# Patient Record
Sex: Female | Born: 1960 | Race: White | Hispanic: No | Smoking: Never smoker
Health system: Southern US, Community
[De-identification: ages and names within clinical notes are randomized; demographics above are authoritative.]

## PROBLEM LIST (undated history)

## (undated) DIAGNOSIS — E785 Hyperlipidemia, unspecified: Secondary | ICD-10-CM

## (undated) DIAGNOSIS — I10 Essential (primary) hypertension: Secondary | ICD-10-CM

## (undated) HISTORY — PX: TONSILLECTOMY: SUR1361

## (undated) HISTORY — DX: Essential (primary) hypertension: I10

## (undated) HISTORY — PX: EYE SURGERY: SHX253

## (undated) HISTORY — PX: FOOT SURGERY: SHX648

## (undated) HISTORY — PX: BUNIONECTOMY: SHX129

## (undated) HISTORY — PX: CHOLECYSTECTOMY: SHX55

## (undated) HISTORY — DX: Hyperlipidemia, unspecified: E78.5

---

## 1998-12-19 ENCOUNTER — Other Ambulatory Visit: Admission: RE | Admit: 1998-12-19 | Discharge: 1998-12-19 | Payer: Self-pay | Admitting: Family Medicine

## 1999-03-12 ENCOUNTER — Encounter: Payer: Self-pay | Admitting: Family Medicine

## 1999-03-12 ENCOUNTER — Encounter: Admission: RE | Admit: 1999-03-12 | Discharge: 1999-03-12 | Payer: Self-pay | Admitting: Family Medicine

## 1999-10-25 ENCOUNTER — Encounter: Payer: Self-pay | Admitting: Emergency Medicine

## 1999-10-25 ENCOUNTER — Emergency Department (HOSPITAL_COMMUNITY): Admission: EM | Admit: 1999-10-25 | Discharge: 1999-10-25 | Payer: Self-pay | Admitting: Emergency Medicine

## 1999-11-11 ENCOUNTER — Encounter: Admission: RE | Admit: 1999-11-11 | Discharge: 1999-11-29 | Payer: Self-pay | Admitting: Family Medicine

## 2000-02-18 ENCOUNTER — Other Ambulatory Visit: Admission: RE | Admit: 2000-02-18 | Discharge: 2000-02-18 | Payer: Self-pay | Admitting: Family Medicine

## 2001-04-12 ENCOUNTER — Other Ambulatory Visit: Admission: RE | Admit: 2001-04-12 | Discharge: 2001-04-12 | Payer: Self-pay | Admitting: Family Medicine

## 2002-05-09 ENCOUNTER — Other Ambulatory Visit: Admission: RE | Admit: 2002-05-09 | Discharge: 2002-05-09 | Payer: Self-pay | Admitting: Family Medicine

## 2004-03-28 ENCOUNTER — Other Ambulatory Visit: Admission: RE | Admit: 2004-03-28 | Discharge: 2004-03-28 | Payer: Self-pay | Admitting: Family Medicine

## 2007-01-23 ENCOUNTER — Emergency Department (HOSPITAL_COMMUNITY): Admission: EM | Admit: 2007-01-23 | Discharge: 2007-01-23 | Payer: Self-pay | Admitting: Family Medicine

## 2007-12-30 ENCOUNTER — Emergency Department (HOSPITAL_COMMUNITY): Admission: EM | Admit: 2007-12-30 | Discharge: 2007-12-30 | Payer: Self-pay | Admitting: Emergency Medicine

## 2009-10-01 ENCOUNTER — Emergency Department (HOSPITAL_COMMUNITY)
Admission: EM | Admit: 2009-10-01 | Discharge: 2009-10-02 | Payer: Self-pay | Source: Home / Self Care | Admitting: Emergency Medicine

## 2009-10-10 ENCOUNTER — Encounter: Admission: RE | Admit: 2009-10-10 | Discharge: 2009-10-10 | Payer: Self-pay | Admitting: Family Medicine

## 2010-01-03 ENCOUNTER — Ambulatory Visit (HOSPITAL_COMMUNITY)
Admission: RE | Admit: 2010-01-03 | Discharge: 2010-01-03 | Payer: Self-pay | Source: Home / Self Care | Attending: Surgery | Admitting: Surgery

## 2010-01-09 ENCOUNTER — Emergency Department (HOSPITAL_COMMUNITY)
Admission: EM | Admit: 2010-01-09 | Discharge: 2010-01-09 | Payer: Self-pay | Source: Home / Self Care | Admitting: Emergency Medicine

## 2010-01-11 ENCOUNTER — Encounter
Admission: RE | Admit: 2010-01-11 | Discharge: 2010-01-11 | Payer: Self-pay | Source: Home / Self Care | Attending: Surgery | Admitting: Surgery

## 2010-04-01 LAB — COMPREHENSIVE METABOLIC PANEL
AST: 158 U/L — ABNORMAL HIGH (ref 0–37)
Albumin: 3.5 g/dL (ref 3.5–5.2)
Calcium: 9.3 mg/dL (ref 8.4–10.5)
Creatinine, Ser: 0.94 mg/dL (ref 0.4–1.2)
GFR calc Af Amer: >60 mL/min (ref >60)
Total Protein: 6.8 g/dL (ref 6.0–8.3)

## 2010-04-01 LAB — CBC
MCH: 28.5 pg (ref 26.0–34.0)
MCHC: 33.2 g/dL (ref 30.0–36.0)
Platelets: 307 10*3/uL (ref 150–400)
RBC: 4.52 MIL/uL (ref 3.87–5.11)

## 2010-04-01 LAB — DIFFERENTIAL
Eosinophils Relative: 1 % (ref 0–5)
Lymphocytes Relative: 31 % (ref 12–46)
Lymphs Abs: 3.8 10*3/uL (ref 0.7–4.0)
Monocytes Absolute: 0.7 10*3/uL (ref 0.1–1.0)

## 2010-04-01 LAB — URINALYSIS, ROUTINE W REFLEX MICROSCOPIC
Bilirubin Urine: NEGATIVE
Hgb urine dipstick: NEGATIVE
Nitrite: NEGATIVE
Specific Gravity, Urine: 1.009 (ref 1.005–1.030)
pH: 7 (ref 5.0–8.0)

## 2010-04-02 LAB — URINALYSIS, ROUTINE W REFLEX MICROSCOPIC
Glucose, UA: NEGATIVE mg/dL
Hgb urine dipstick: NEGATIVE
Specific Gravity, Urine: 1.008 (ref 1.005–1.030)
pH: 5.5 (ref 5.0–8.0)

## 2010-04-02 LAB — CBC
Hemoglobin: 13.1 g/dL (ref 12.0–15.0)
MCHC: 32.5 g/dL (ref 30.0–36.0)
Platelets: 262 10*3/uL (ref 150–400)
RBC: 4.64 MIL/uL (ref 3.87–5.11)

## 2010-04-02 LAB — SURGICAL PCR SCREEN: MRSA, PCR: NEGATIVE

## 2010-04-02 LAB — DIFFERENTIAL
Eosinophils Absolute: 0 10*3/uL (ref 0.0–0.7)
Eosinophils Relative: 1 % (ref 0–5)
Lymphs Abs: 1.9 10*3/uL (ref 0.7–4.0)
Monocytes Absolute: 0.3 10*3/uL (ref 0.1–1.0)
Monocytes Relative: 5 % (ref 3–12)

## 2010-04-02 LAB — COMPREHENSIVE METABOLIC PANEL
ALT: 8 U/L (ref 0–35)
AST: 14 U/L (ref 0–37)
Albumin: 3.9 g/dL (ref 3.5–5.2)
CO2: 26 mEq/L (ref 19–32)
Calcium: 9.5 mg/dL (ref 8.4–10.5)
GFR calc Af Amer: >60 mL/min (ref >60)
GFR calc non Af Amer: >60 mL/min (ref >60)
Sodium: 138 mEq/L (ref 135–145)
Total Protein: 7.2 g/dL (ref 6.0–8.3)

## 2010-04-02 LAB — AMYLASE: Amylase: 81 U/L (ref 0–105)

## 2010-04-04 LAB — CBC
HCT: 37.8 % (ref 36.0–46.0)
Hemoglobin: 12.3 g/dL (ref 12.0–15.0)
MCH: 28.2 pg (ref 26.0–34.0)
MCHC: 32.5 g/dL (ref 30.0–36.0)
MCV: 86.7 fL (ref 78.0–100.0)
Platelets: 276 10*3/uL (ref 150–400)
RBC: 4.36 MIL/uL (ref 3.87–5.11)
RDW: 13.5 % (ref 11.5–15.5)
WBC: 7.8 10*3/uL (ref 4.0–10.5)

## 2010-04-04 LAB — BASIC METABOLIC PANEL WITH GFR
BUN: 6 mg/dL (ref 6–23)
Calcium: 9.1 mg/dL (ref 8.4–10.5)
Creatinine, Ser: 0.58 mg/dL (ref 0.4–1.2)
GFR calc non Af Amer: >60 mL/min (ref >60)
Glucose, Bld: 110 mg/dL — ABNORMAL HIGH (ref 70–99)
Potassium: 3.2 meq/L — ABNORMAL LOW (ref 3.5–5.1)

## 2010-04-04 LAB — DIFFERENTIAL
Basophils Absolute: 0 K/uL (ref 0.0–0.1)
Basophils Relative: 0 % (ref 0–1)
Eosinophils Absolute: 0.1 10*3/uL (ref 0.0–0.7)
Eosinophils Relative: 1 % (ref 0–5)
Lymphocytes Relative: 30 % (ref 12–46)
Lymphs Abs: 2.4 10*3/uL (ref 0.7–4.0)
Monocytes Absolute: 0.5 K/uL (ref 0.1–1.0)
Monocytes Relative: 7 % (ref 3–12)
Neutro Abs: 4.9 K/uL (ref 1.7–7.7)
Neutrophils Relative %: 62 % (ref 43–77)

## 2010-04-04 LAB — POCT CARDIAC MARKERS
CKMB, poc: <1 ng/mL — ABNORMAL LOW (ref 1.0–8.0)
Myoglobin, poc: 44 ng/mL (ref 12–200)
Troponin i, poc: <0.05 ng/mL (ref 0.00–0.09)

## 2010-04-04 LAB — BASIC METABOLIC PANEL
CO2: 28 mEq/L (ref 19–32)
Chloride: 102 mEq/L (ref 96–112)
GFR calc Af Amer: >60 mL/min (ref >60)
Sodium: 135 mEq/L (ref 135–145)

## 2011-10-05 IMAGING — CT CT ABD-PELV W/ CM
1 of 3 series · 14 of 32 positions shown, 19 images · IV contrast (omnipaque)
Comparison: CT abdomen pelvis of 10/02/2009

CLINICAL DATA: Epigastric pain, nausea, status post laparoscopic
cholecystectomy on 01/03/2010

CT ABDOMEN AND PELVIS WITH CONTRAST
TECHNIQUE: Multidetector CT imaging of the abdomen and pelvis was
performed following the standard protocol during bolus
administration of intravenous contrast.
Contrast: 100 ml Omnipaque-S66

[Series 2: rtn ap with st · axial · 0.78mm/px · z∈[+784,+1184]mm · 14 of 90 slices shown, 19 images]
[im 5/90  soft-tissue]
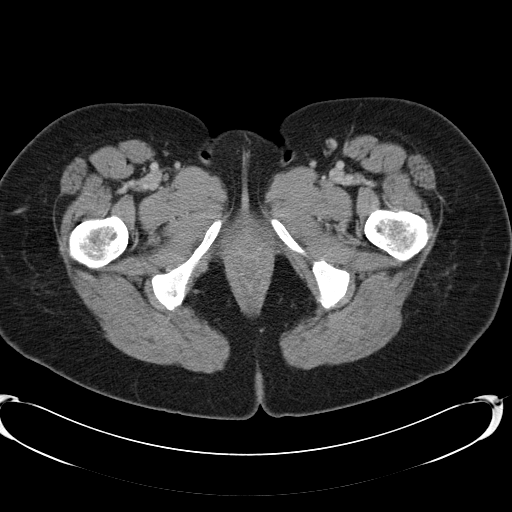
[im 5/90  bone]
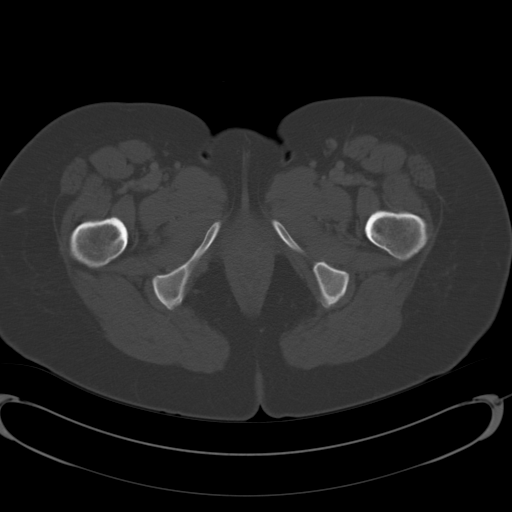
[im 14/90  soft-tissue]
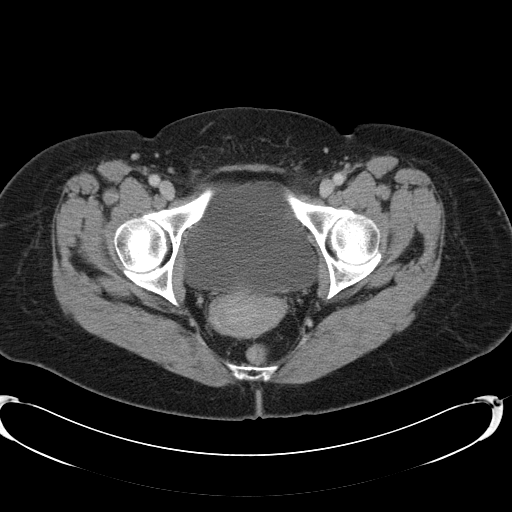
[im 18/90  soft-tissue]
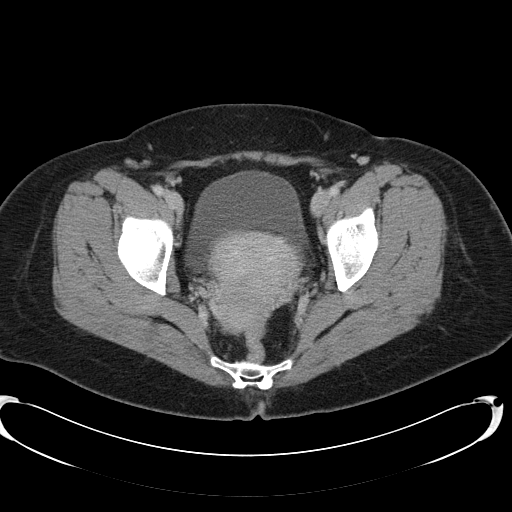
[im 27/90  soft-tissue]
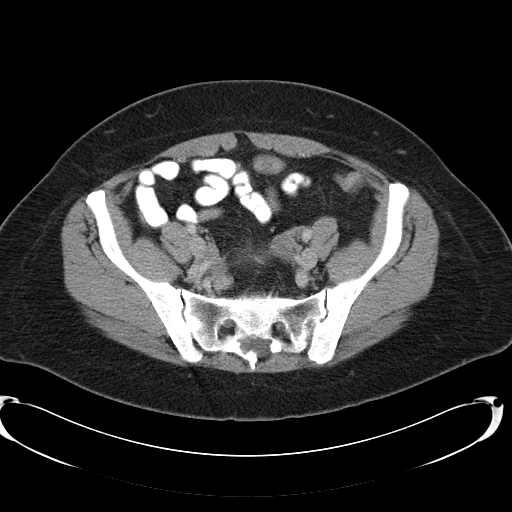
[im 32/90  soft-tissue]
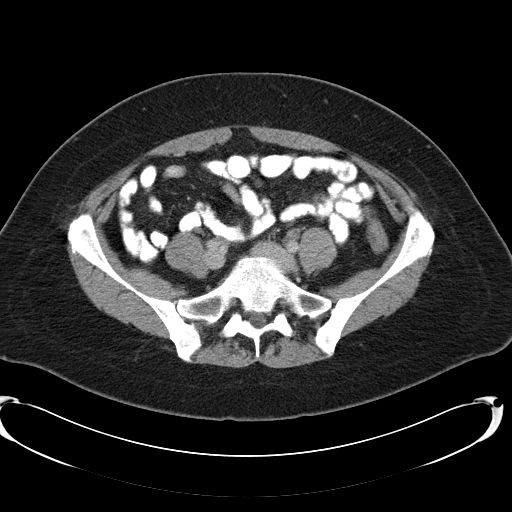
[im 41/90  soft-tissue]
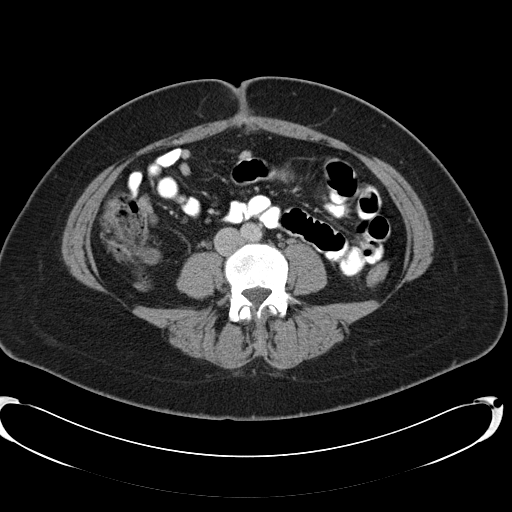
[im 45/90  soft-tissue]
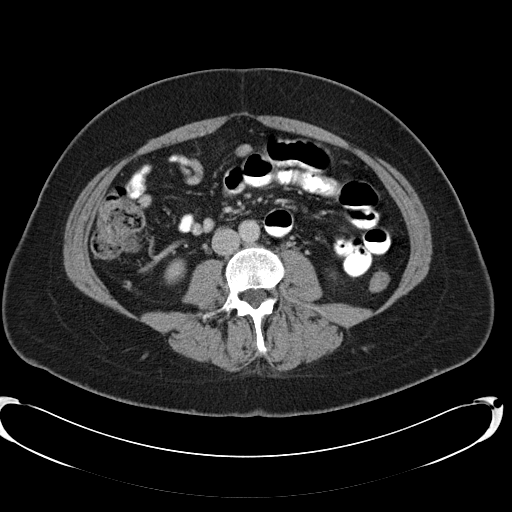
[im 49/90  soft-tissue]
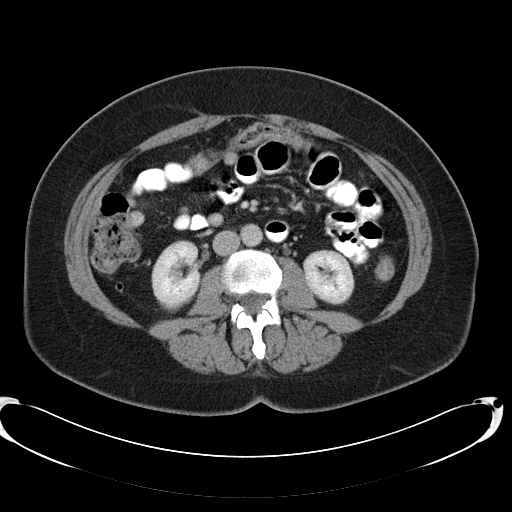
[im 58/90  soft-tissue]
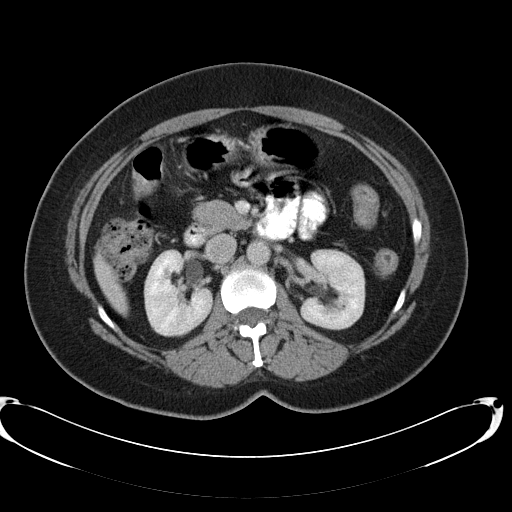
[im 58/90  bone]
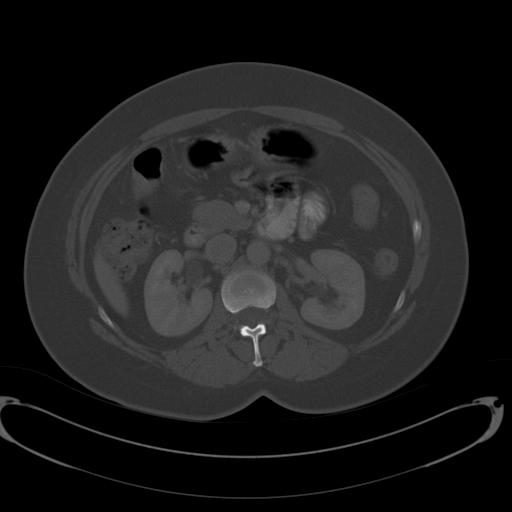
[im 63/90  soft-tissue]
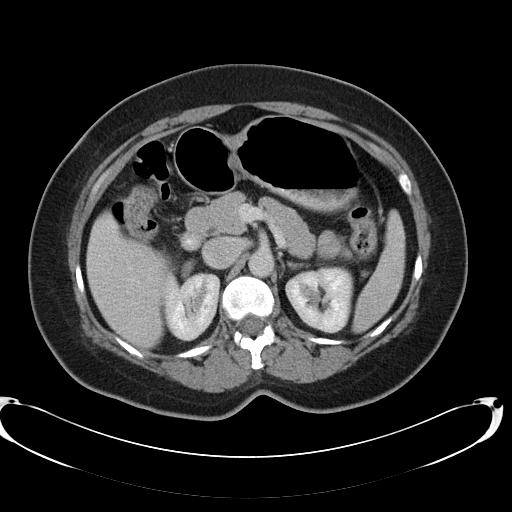
[im 72/90  soft-tissue]
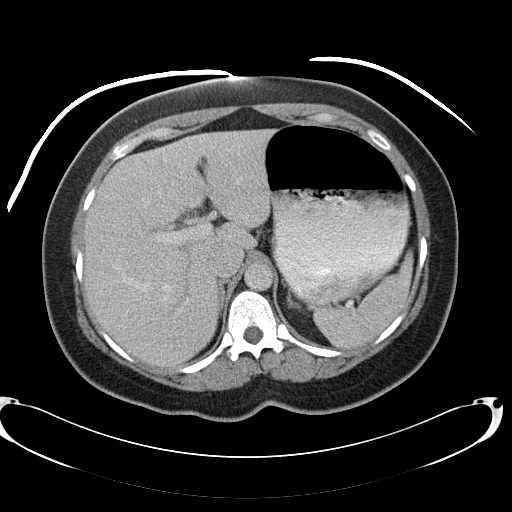
[im 72/90  lung]
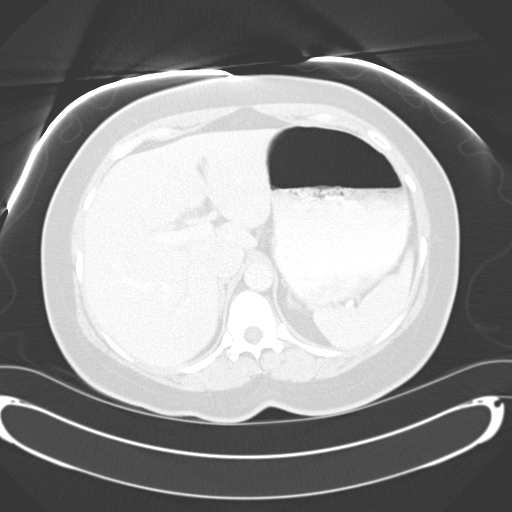
[im 76/90  soft-tissue]
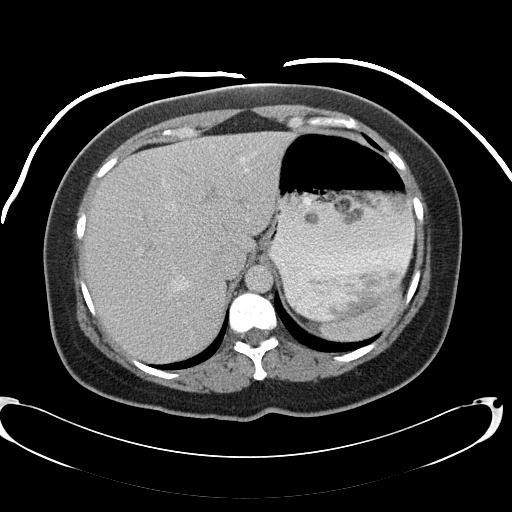
[im 76/90  lung]
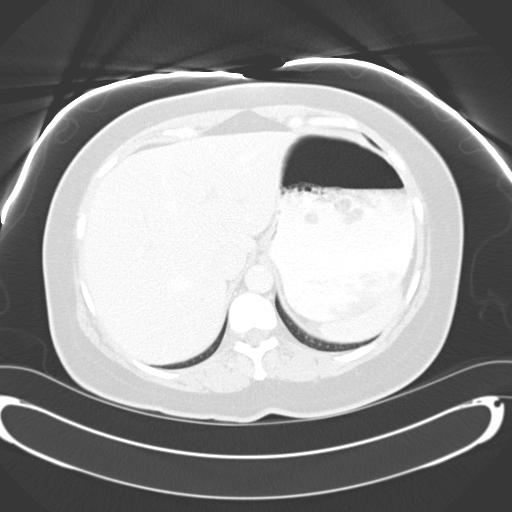
[im 81/90  lung]
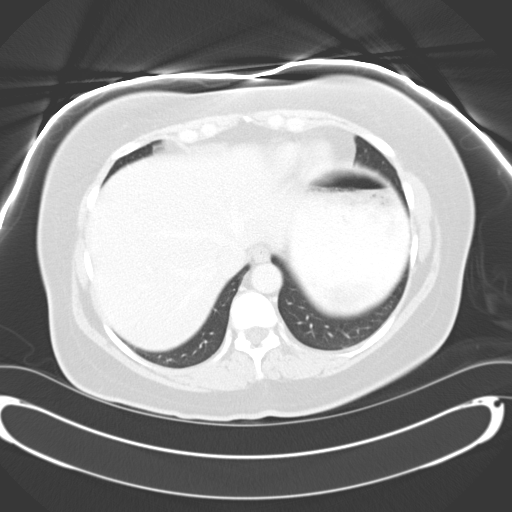
[im 85/90  soft-tissue]
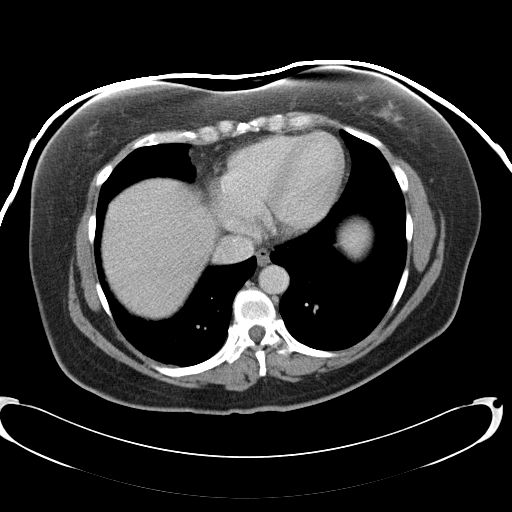
[im 85/90  lung]
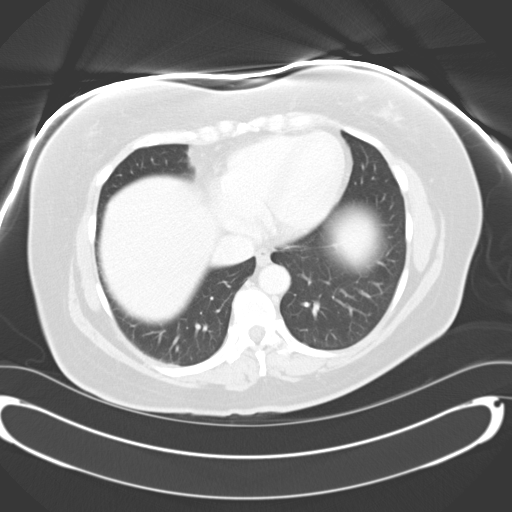

[14 of 32 positions shown; findings below may reference images not displayed]

FINDINGS: The lung bases are clear.  The liver enhances with no
focal abnormality.  There is minimal prominence of central
intrahepatic ducts most likely normal in this patient post
cholecystectomy.  No fluid collection is seen in the gallbladder
bed with minimal strandiness presumably postoperative in nature.
The pancreas is normal in size and the pancreatic duct is not
dilated.  The adrenal glands and spleen are unremarkable.  The
stomach is moderately distended with food and fluid, and is
unremarkable.  The kidneys enhance with no calculus or mass and no
hydronephrosis is seen.  Small parapelvic  cysts are noted on the
left.  The abdominal aorta is normal in caliber.  The appendix as
well visualized in the right lower quadrant extending cephalad, and
filling with air normally.  The terminal ileum is unremarkable.
The uterus again is somewhat lobular as described previously.  The
urinary bladder is unremarkable.  Small ovarian follicles are
present.  No significant free fluid is seen within the pelvis.  No
distention of large or small bowel is seen.  No bony abnormality is
seen
IMPRESSION: .
1.  No evidence of acute inflammatory process within the abdomen or
pelvis.
2.  The appendix and terminal ileum appear normal.
3.  No abnormality in the gallbladder bed status post laparoscopic
cholecystectomy.  Expected postoperative change.

## 2012-04-22 ENCOUNTER — Ambulatory Visit (INDEPENDENT_AMBULATORY_CARE_PROVIDER_SITE_OTHER): Payer: Managed Care, Other (non HMO) | Admitting: Podiatry

## 2012-04-22 ENCOUNTER — Encounter: Payer: Self-pay | Admitting: Podiatry

## 2012-04-22 VITALS — BP 149/85 | HR 67 | Ht 62.0 in | Wt 194.0 lb

## 2012-04-22 DIAGNOSIS — L988 Other specified disorders of the skin and subcutaneous tissue: Secondary | ICD-10-CM

## 2012-04-22 DIAGNOSIS — R234 Changes in skin texture: Secondary | ICD-10-CM | POA: Insufficient documentation

## 2012-04-22 DIAGNOSIS — M25579 Pain in unspecified ankle and joints of unspecified foot: Secondary | ICD-10-CM

## 2012-04-22 NOTE — Progress Notes (Signed)
Subjective: 52 y.o. year old female patient presents complaining cracking heel. She has tried home remedy that helped for a while. Now both heels are painful from cracks. trimmed. Patient was seen at this office several years ago for the same problem.  Review of Systems - General ROS: negative for - chills, fatigue, fever, hot flashes, malaise, night sweats, sleep disturbance, weight gain or weight loss.  Objective: Dermatologic: Thick dry callus with cracked skin both heels. No open skin noted. Fissures limited to epidermal layer without deep penetration to subdermal layer. Vascular: Pedal pulses are all palpable. Orthopedic: Rectus foot without gross deformity bilateral. Neurologic: All epicritic and tactile sensations grossly intact.  Assessment: Fissured callus both heels.  Treatment: Both feet soaked for 10 minutes. Debrided all hard callused area. Return as needed.

## 2012-04-22 NOTE — Patient Instructions (Addendum)
Seen for heel callus. Debrided all cracked heel with soaking. Please continue home remedy, scrub at the end of shower with soap.  Return as needed.

## 2012-07-22 ENCOUNTER — Ambulatory Visit: Payer: Managed Care, Other (non HMO) | Admitting: Podiatry

## 2012-07-27 ENCOUNTER — Ambulatory Visit: Payer: Managed Care, Other (non HMO) | Admitting: Podiatry

## 2012-09-16 DIAGNOSIS — E782 Mixed hyperlipidemia: Secondary | ICD-10-CM | POA: Insufficient documentation

## 2013-03-29 ENCOUNTER — Ambulatory Visit: Payer: Managed Care, Other (non HMO) | Admitting: Podiatry

## 2013-04-05 ENCOUNTER — Encounter: Payer: Self-pay | Admitting: Podiatry

## 2013-04-05 ENCOUNTER — Ambulatory Visit (INDEPENDENT_AMBULATORY_CARE_PROVIDER_SITE_OTHER): Payer: Managed Care, Other (non HMO) | Admitting: Podiatry

## 2013-04-05 VITALS — BP 133/83 | HR 71 | Ht 62.0 in | Wt 195.0 lb

## 2013-04-05 DIAGNOSIS — M79609 Pain in unspecified limb: Secondary | ICD-10-CM

## 2013-04-05 DIAGNOSIS — L851 Acquired keratosis [keratoderma] palmaris et plantaris: Secondary | ICD-10-CM

## 2013-04-05 DIAGNOSIS — R234 Changes in skin texture: Secondary | ICD-10-CM

## 2013-04-05 DIAGNOSIS — Q828 Other specified congenital malformations of skin: Secondary | ICD-10-CM

## 2013-04-05 DIAGNOSIS — L988 Other specified disorders of the skin and subcutaneous tissue: Secondary | ICD-10-CM

## 2013-04-05 NOTE — Progress Notes (Signed)
Subjective:  53 y.o. year old female patient presents complaining cracking heel.  Both heels are painful from cracks.  Patient was seen at this office several last year for the same problem.   Objective: Dermatologic: Thick dry callus with cracked skin both heels.  No open skin noted. Fissures limited to epidermal layer without deep penetration to subdermal layer.  Vascular: Pedal pulses are all palpable.  Orthopedic: Rectus foot without gross deformity bilateral.  Neurologic: All epicritic and tactile sensations grossly intact.   Assessment:  Painful fissured callus both heels.   Treatment: Both feet soaked for 10 minutes. Debrided all hard callused area.  Return as needed.

## 2013-04-05 NOTE — Patient Instructions (Signed)
Seen for painful heel with cracked calluses. All debrided. Return as needed.

## 2013-07-21 DIAGNOSIS — E669 Obesity, unspecified: Secondary | ICD-10-CM | POA: Insufficient documentation

## 2013-09-20 DIAGNOSIS — Z8049 Family history of malignant neoplasm of other genital organs: Secondary | ICD-10-CM | POA: Insufficient documentation

## 2014-02-24 ENCOUNTER — Encounter: Payer: Self-pay | Admitting: Podiatry

## 2014-02-24 ENCOUNTER — Ambulatory Visit (INDEPENDENT_AMBULATORY_CARE_PROVIDER_SITE_OTHER): Payer: Managed Care, Other (non HMO) | Admitting: Podiatry

## 2014-02-24 VITALS — BP 142/77 | HR 59

## 2014-02-24 DIAGNOSIS — Q828 Other specified congenital malformations of skin: Secondary | ICD-10-CM

## 2014-02-24 DIAGNOSIS — R234 Changes in skin texture: Secondary | ICD-10-CM

## 2014-02-24 DIAGNOSIS — L570 Actinic keratosis: Secondary | ICD-10-CM

## 2014-02-24 DIAGNOSIS — L989 Disorder of the skin and subcutaneous tissue, unspecified: Secondary | ICD-10-CM

## 2014-02-24 MED ORDER — CICLOPIROX 8 % EX SOLN: Freq: Every day | CUTANEOUS | Status: DC

## 2014-02-24 NOTE — Patient Instructions (Signed)
Seen for cracking heel calluses. All calluses debrided. Rx. Penlac for fungal nails.  Return in 3 months or as needed.

## 2014-02-24 NOTE — Progress Notes (Signed)
Subjective:  54 y.o. year old female patient presents complaining cracking heel.  Both heels are painful from cracks.  Her last visit to the office was March 2015 for the same problem.  Objective: Dermatologic: Thick dry callus with cracked skin both heels.  Discoloration on nail plate with thickness on 2nd toe both feet. Dark discolored 2nd toe left foot.  Fissures limited to epidermal layer without deep penetration to subdermal layer.  Vascular: Pedal pulses are all palpable.  Orthopedic: Rectus foot without gross deformity bilateral.  Neurologic: All epicritic and tactile sensations grossly intact.   Assessment:  Painful fissured callus both heels.  Mycotic nails x 10, early stage.  Treatment: Both feet soaked for 10 minutes. Debrided all hard callused area.  Rx. Penlac. Return as needed.

## 2014-06-02 ENCOUNTER — Ambulatory Visit: Payer: Managed Care, Other (non HMO) | Admitting: Podiatry

## 2015-02-28 ENCOUNTER — Ambulatory Visit (INDEPENDENT_AMBULATORY_CARE_PROVIDER_SITE_OTHER): Payer: Managed Care, Other (non HMO) | Admitting: Podiatry

## 2015-02-28 ENCOUNTER — Encounter: Payer: Self-pay | Admitting: Podiatry

## 2015-02-28 VITALS — BP 163/90 | HR 80

## 2015-02-28 DIAGNOSIS — Q828 Other specified congenital malformations of skin: Secondary | ICD-10-CM

## 2015-02-28 DIAGNOSIS — M79606 Pain in leg, unspecified: Secondary | ICD-10-CM | POA: Diagnosis not present

## 2015-02-28 DIAGNOSIS — L570 Actinic keratosis: Secondary | ICD-10-CM | POA: Diagnosis not present

## 2015-02-28 DIAGNOSIS — R234 Changes in skin texture: Secondary | ICD-10-CM

## 2015-02-28 DIAGNOSIS — L989 Disorder of the skin and subcutaneous tissue, unspecified: Secondary | ICD-10-CM

## 2015-02-28 NOTE — Progress Notes (Signed)
Subjective:  55 y.o. year old female patient presents complaining cracking heel.  Both heels are painful from cracks and right heel has bled.  Her last visit to the office was February 2016 for the same problem.  Objective: Dermatologic:  Thick dry fissured keratosis both heels plantar posterior. Open skin lesion plantar lateral right heel. Discoloration on nail plate with thickness on 2nd toe both feet. Dark discolored 2nd toe left foot.  Fissures limited to skin layer without deep penetration to subdermal layer.  Vascular: Pedal pulses are all palpable.  Orthopedic: Rectus foot without gross deformity bilateral.  Neurologic: All epicritic and tactile sensations grossly intact.   Assessment:  Painful fissured callus both heels with open lesion right lateral heel.  Plantar heel keratosis bilateral.  Treatment: Both feet soaked for 10 minutes.  Debrided all fissured keratotic lesion bilateral. Right plantar lateral heel open lesion cleansed with Iodine and DSD applied. Return as needed.

## 2015-02-28 NOTE — Patient Instructions (Signed)
Seen for cracking callus on both heels. Soaked and debrided. Return as needed.

## 2016-05-22 ENCOUNTER — Ambulatory Visit (INDEPENDENT_AMBULATORY_CARE_PROVIDER_SITE_OTHER): Payer: Managed Care, Other (non HMO) | Admitting: Podiatry

## 2016-05-22 ENCOUNTER — Encounter: Payer: Self-pay | Admitting: Podiatry

## 2016-05-22 VITALS — BP 149/84 | HR 67

## 2016-05-22 DIAGNOSIS — R234 Changes in skin texture: Secondary | ICD-10-CM | POA: Diagnosis not present

## 2016-05-22 DIAGNOSIS — Q828 Other specified congenital malformations of skin: Secondary | ICD-10-CM

## 2016-05-22 NOTE — Progress Notes (Signed)
Subjective:  56 y.o. year old female patient presents complaining cracking heel.  She is using Vitamin A cream with Salsun blue scrub after each shower. She noted of improvement. Her last visit to the office was February 2017 for the same problem.  Objective: Dermatologic:  Thick dry fissured keratosis both heels plantar posterior. Fissures limited to skin layer without deep penetration to subdermal layer.  Vascular: Pedal pulses are all palpable.  Orthopedic: Rectus foot without gross deformity bilateral.  Neurologic: All epicritic and tactile sensations grossly intact.   Assessment:  Painful fissured callus both heels without open skin. Plantar heel keratosis bilateral.  Treatment: Debrided all fissured keratotic lesion bilateral. Return as needed.

## 2016-05-22 NOTE — Patient Instructions (Signed)
Follow up on plantar heel fissured calluses. Both heels are doing well with minor dry cracks in skin.  All debrided. Continue with Salsun blue scrub and Vitamin A cream. Return as needed.

## 2017-05-27 ENCOUNTER — Ambulatory Visit (INDEPENDENT_AMBULATORY_CARE_PROVIDER_SITE_OTHER): Payer: 59 | Admitting: Podiatry

## 2017-05-27 DIAGNOSIS — R234 Changes in skin texture: Secondary | ICD-10-CM | POA: Diagnosis not present

## 2017-05-27 DIAGNOSIS — Q828 Other specified congenital malformations of skin: Secondary | ICD-10-CM

## 2017-05-27 NOTE — Patient Instructions (Signed)
Seen for cracking callus on left heel. All debrided. Return as needed.

## 2017-05-28 ENCOUNTER — Encounter: Payer: Self-pay | Admitting: Podiatry

## 2017-05-28 NOTE — Progress Notes (Signed)
Subjective: 57 y.o. year old female patient presents for follow up on cracking heels.  Objective: Dermatologic: Thick dry fissured keratosis on right heel. Left heel has much improved with smooth surface..  Vascular:Pedal pulses are all palpable.  Orthopedic:Rectus foot without gross deformity bilateral.  Neurologic:All epicritic and tactile sensations grossly intact.   Assessment: Painful fissured callus on left without open skin. Right heel has much improved. Plantar heel keratosis bilateral.  Treatment: Debrided all fissured keratotic lesion bilateral. Return as needed.

## 2017-06-18 LAB — HM PAP SMEAR

## 2018-08-04 DIAGNOSIS — H524 Presbyopia: Secondary | ICD-10-CM | POA: Diagnosis not present

## 2018-09-17 DIAGNOSIS — Z0100 Encounter for examination of eyes and vision without abnormal findings: Secondary | ICD-10-CM | POA: Diagnosis not present

## 2019-10-10 DIAGNOSIS — H524 Presbyopia: Secondary | ICD-10-CM | POA: Diagnosis not present

## 2020-01-27 ENCOUNTER — Ambulatory Visit (INDEPENDENT_AMBULATORY_CARE_PROVIDER_SITE_OTHER): Payer: No Typology Code available for payment source | Admitting: Family Medicine

## 2020-01-27 ENCOUNTER — Encounter: Payer: Self-pay | Admitting: Family Medicine

## 2020-01-27 ENCOUNTER — Other Ambulatory Visit: Payer: Self-pay

## 2020-01-27 VITALS — BP 126/78 | HR 74 | Temp 98.3°F | Resp 18 | Ht 62.0 in | Wt 215.0 lb

## 2020-01-27 DIAGNOSIS — Z8049 Family history of malignant neoplasm of other genital organs: Secondary | ICD-10-CM | POA: Diagnosis not present

## 2020-01-27 DIAGNOSIS — Z1231 Encounter for screening mammogram for malignant neoplasm of breast: Secondary | ICD-10-CM | POA: Diagnosis not present

## 2020-01-27 DIAGNOSIS — E669 Obesity, unspecified: Secondary | ICD-10-CM | POA: Diagnosis not present

## 2020-01-27 DIAGNOSIS — E782 Mixed hyperlipidemia: Secondary | ICD-10-CM | POA: Diagnosis not present

## 2020-01-27 DIAGNOSIS — R7989 Other specified abnormal findings of blood chemistry: Secondary | ICD-10-CM

## 2020-01-27 DIAGNOSIS — Z0000 Encounter for general adult medical examination without abnormal findings: Secondary | ICD-10-CM

## 2020-01-27 DIAGNOSIS — Z800 Family history of malignant neoplasm of digestive organs: Secondary | ICD-10-CM | POA: Diagnosis not present

## 2020-01-27 LAB — COMPREHENSIVE METABOLIC PANEL
ALT: 44 U/L — ABNORMAL HIGH (ref 0–35)
AST: 38 U/L — ABNORMAL HIGH (ref 0–37)
Albumin: 4.9 g/dL (ref 3.5–5.2)
Alkaline Phosphatase: 70 U/L (ref 39–117)
BUN: 15 mg/dL (ref 6–23)
CO2: 24 mEq/L (ref 19–32)
Calcium: 10.3 mg/dL (ref 8.4–10.5)
Chloride: 104 mEq/L (ref 96–112)
Creatinine, Ser: 0.8 mg/dL (ref 0.40–1.20)
GFR: 81.57 mL/min (ref >60.00)
Glucose, Bld: 90 mg/dL (ref 70–99)
Potassium: 4 mEq/L (ref 3.5–5.1)
Sodium: 137 mEq/L (ref 135–145)
Total Bilirubin: 0.4 mg/dL (ref 0.2–1.2)
Total Protein: 7.7 g/dL (ref 6.0–8.3)

## 2020-01-27 LAB — CBC WITH DIFFERENTIAL/PLATELET
Basophils Absolute: 0 10*3/uL (ref 0.0–0.1)
Basophils Relative: 0.8 % (ref 0.0–3.0)
Eosinophils Absolute: 0.1 10*3/uL (ref 0.0–0.7)
Eosinophils Relative: 0.9 % (ref 0.0–5.0)
HCT: 43.2 % (ref 36.0–46.0)
Hemoglobin: 14.6 g/dL (ref 12.0–15.0)
Lymphocytes Relative: 36.7 % (ref 12.0–46.0)
Lymphs Abs: 2.2 10*3/uL (ref 0.7–4.0)
MCHC: 33.9 g/dL (ref 30.0–36.0)
MCV: 85.2 fl (ref 78.0–100.0)
Monocytes Absolute: 0.3 10*3/uL (ref 0.1–1.0)
Monocytes Relative: 5.4 % (ref 3.0–12.0)
Neutro Abs: 3.4 10*3/uL (ref 1.4–7.7)
Neutrophils Relative %: 56.2 % (ref 43.0–77.0)
Platelets: 257 10*3/uL (ref 150.0–400.0)
RBC: 5.07 Mil/uL (ref 3.87–5.11)
RDW: 13.9 % (ref 11.5–15.5)
WBC: 6.1 10*3/uL (ref 4.0–10.5)

## 2020-01-27 LAB — LIPID PANEL
Cholesterol: 231 mg/dL — ABNORMAL HIGH (ref 0–200)
HDL: 61.4 mg/dL (ref >39.00)
LDL Cholesterol: 151 mg/dL — ABNORMAL HIGH (ref 0–99)
NonHDL: 169.8
Total CHOL/HDL Ratio: 4
Triglycerides: 94 mg/dL (ref 0.0–149.0)
VLDL: 18.8 mg/dL (ref 0.0–40.0)

## 2020-01-27 LAB — TSH: TSH: 1.13 u[IU]/mL (ref 0.35–4.50)

## 2020-01-27 LAB — HEMOGLOBIN A1C: Hgb A1c MFr Bld: 5.5 % (ref 4.6–6.5)

## 2020-01-27 NOTE — Patient Instructions (Signed)
It was so good seeing you again! Thank you for establishing with my new practice and allowing me to continue caring for you. It means a lot to me.   Please schedule a follow up appointment with me in 12 months for your annual complete physical; please come fasting. Sooner if you need more help with weight loss or your knee.   Please sign release of records from Dr. Roseanne Kaufman office.   Eat more plant based foods; explore the websites for vegan and veggie diets to get ideas!
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
## 2020-01-29 LAB — HIV ANTIBODY (ROUTINE TESTING W REFLEX): HIV 1&2 Ab, 4th Generation: NONREACTIVE

## 2020-01-30 NOTE — Addendum Note (Signed)
Addended by: Asencion Partridge on: 01/30/2020 04:30 PM   Modules accepted: Orders

## 2020-01-30 NOTE — Progress Notes (Signed)
Please call patient: I have reviewed his/her lab results. Labs look mostly good. Cholesterol is borderline high and eating a low cholesterol diet will be helpul.  Her liver tests are mildly elevated; they have been elevated in the past; I recommend a low fat diet, weight loss, avoidance of tylenol and alcohol. And a f/u lab visit in 3 months for hepatic funciton panel, hep C ab [place] and hep b screen; i've placed order; please schedule lab visit. Will need ultrasound if persists. Could be due to a fatty liver other things. Thanks. (normal liver on [place] form 2011)/  The 10-year ASCVD risk score Denman George DC Jr., et al., 2013) is: 2.5%   Values used to calculate the score:     Age: 60 years     Sex: Female     Is Non-Hispanic African American: No     Diabetic: No     Tobacco smoker: No     Systolic Blood Pressure: 126 mmHg     Is BP treated: No     HDL Cholesterol: 61.4 mg/dL     Total Cholesterol: 231 mg/dL

## 2020-03-26 DIAGNOSIS — B351 Tinea unguium: Secondary | ICD-10-CM | POA: Diagnosis not present

## 2020-03-26 DIAGNOSIS — B353 Tinea pedis: Secondary | ICD-10-CM | POA: Diagnosis not present

## 2020-03-26 DIAGNOSIS — L858 Other specified epidermal thickening: Secondary | ICD-10-CM | POA: Diagnosis not present

## 2020-03-26 DIAGNOSIS — L603 Nail dystrophy: Secondary | ICD-10-CM | POA: Diagnosis not present

## 2020-04-16 DIAGNOSIS — L309 Dermatitis, unspecified: Secondary | ICD-10-CM | POA: Diagnosis not present

## 2020-04-16 DIAGNOSIS — B351 Tinea unguium: Secondary | ICD-10-CM | POA: Diagnosis not present

## 2020-05-28 ENCOUNTER — Other Ambulatory Visit: Payer: Self-pay | Admitting: Family Medicine

## 2020-05-28 ENCOUNTER — Ambulatory Visit
Admission: RE | Admit: 2020-05-28 | Discharge: 2020-05-28 | Disposition: A | Discharge status: Home / Self Care | Payer: No Typology Code available for payment source | Source: Ambulatory Visit | Attending: Family Medicine | Admitting: Family Medicine

## 2020-05-28 ENCOUNTER — Other Ambulatory Visit: Payer: Self-pay

## 2020-05-28 DIAGNOSIS — Z0000 Encounter for general adult medical examination without abnormal findings: Secondary | ICD-10-CM

## 2020-05-28 DIAGNOSIS — Z1231 Encounter for screening mammogram for malignant neoplasm of breast: Secondary | ICD-10-CM

## 2020-06-01 DIAGNOSIS — N951 Menopausal and female climacteric states: Secondary | ICD-10-CM | POA: Diagnosis not present

## 2020-06-01 DIAGNOSIS — R635 Abnormal weight gain: Secondary | ICD-10-CM | POA: Diagnosis not present

## 2020-06-26 DIAGNOSIS — B351 Tinea unguium: Secondary | ICD-10-CM | POA: Diagnosis not present

## 2020-06-26 DIAGNOSIS — L309 Dermatitis, unspecified: Secondary | ICD-10-CM | POA: Diagnosis not present

## 2020-07-12 DIAGNOSIS — N951 Menopausal and female climacteric states: Secondary | ICD-10-CM | POA: Diagnosis not present

## 2020-10-12 DIAGNOSIS — N951 Menopausal and female climacteric states: Secondary | ICD-10-CM | POA: Diagnosis not present

## 2020-10-25 DIAGNOSIS — R635 Abnormal weight gain: Secondary | ICD-10-CM | POA: Diagnosis not present

## 2021-01-30 DIAGNOSIS — N951 Menopausal and female climacteric states: Secondary | ICD-10-CM | POA: Diagnosis not present

## 2021-04-08 DIAGNOSIS — J0140 Acute pansinusitis, unspecified: Secondary | ICD-10-CM | POA: Diagnosis not present

## 2021-07-29 DIAGNOSIS — Z1211 Encounter for screening for malignant neoplasm of colon: Secondary | ICD-10-CM | POA: Diagnosis not present

## 2021-07-31 DIAGNOSIS — R030 Elevated blood-pressure reading, without diagnosis of hypertension: Secondary | ICD-10-CM | POA: Diagnosis not present

## 2021-08-02 ENCOUNTER — Ambulatory Visit (INDEPENDENT_AMBULATORY_CARE_PROVIDER_SITE_OTHER): Payer: 59 | Admitting: Family Medicine

## 2021-08-02 ENCOUNTER — Encounter: Payer: Self-pay | Admitting: Family Medicine

## 2021-08-02 VITALS — BP 156/86 | HR 80 | Temp 97.8°F | Ht 62.0 in | Wt 216.0 lb

## 2021-08-02 DIAGNOSIS — I10 Essential (primary) hypertension: Secondary | ICD-10-CM | POA: Diagnosis not present

## 2021-08-02 DIAGNOSIS — E782 Mixed hyperlipidemia: Secondary | ICD-10-CM

## 2021-08-02 DIAGNOSIS — R011 Cardiac murmur, unspecified: Secondary | ICD-10-CM

## 2021-08-02 LAB — BASIC METABOLIC PANEL
BUN: 15 mg/dL (ref 6–23)
CO2: 26 mEq/L (ref 19–32)
Calcium: 10.2 mg/dL (ref 8.4–10.5)
Chloride: 103 mEq/L (ref 96–112)
Creatinine, Ser: 0.91 mg/dL (ref 0.40–1.20)
GFR: 69.14 mL/min (ref >60.00)
Glucose, Bld: 75 mg/dL (ref 70–99)
Potassium: 3.8 mEq/L (ref 3.5–5.1)
Sodium: 140 mEq/L (ref 135–145)

## 2021-08-02 LAB — TSH: TSH: 0.97 u[IU]/mL (ref 0.35–5.50)

## 2021-08-02 MED ORDER — LISINOPRIL-HYDROCHLOROTHIAZIDE 10-12.5 MG PO TABS: 1.0000 | ORAL_TABLET | Freq: Every day | ORAL | 3 refills | Status: DC

## 2021-08-02 NOTE — Progress Notes (Signed)
Subjective  CC:  Chief Complaint  Patient presents with   Hypertension    Pt has been experiencing elevated Bp. Been ranging in the 180 when she went in for a procedure. 172/97 this morning and the back of her head is burning. Was given Hydrochlorothiazide     HPI: Alexis Schmidt is a 61 y.o. female who presents to the office today to address the problems listed above in the chief complaint. Hypertension new diagnosis: pt last here in 2021. Was at GI office earlier this week with very elevated bp. 180/101. Feels well. Last checked prior in march at the gym and reports systolic was 136.started home monitoring this week and has had several elevated readings as noted above. [facility] evaluated, nl cmp and cbc and started hctz 12.5.  Pt w/o cp, sob, edema, stress, change in diet or exercise, palpitations, tremor or sweats. + FH of HTN. Mom with late onset CAD. Pt w/o diabetes. + obesity and elevated cholesterol untreated. Eats well and exercises regularly. Non smoker  Assessment  1. Essential hypertension   2. Heart murmur   3. Mixed hyperlipidemia      Plan   Hypertension new dx: education given. EKG w/o comparison is unremarkable.  Just started low dose hctz with mild improvement; change to lisinopril hctz 10/12.5 and recheck renal and lytes today. Check thyroid and cbc. No trigger or sxs to suggest secondary HTN. Low salt diet recommended. Pt to continue home monitoring and notify me if Bps remain elevated . Heart murmur: in setting of newly elevated bp, will assess with echocardiogram.  HLD: will check fasting lipids at cpe. Rec low fat diet.   Education regarding management of these chronic disease states was given. Management strategies discussed on successive visits include dietary and exercise recommendations, goals of achieving and maintaining IBW, and lifestyle modifications aiming for adequate sleep and minimizing stressors.   Follow up: as scheduled oct cpe and f/u  HTN  Orders Placed This Encounter  Procedures   Basic metabolic panel   TSH   EKG 12-Lead   ECHOCARDIOGRAM COMPLETE   Meds ordered this encounter  Medications   lisinopril-hydrochlorothiazide (ZESTORETIC) 10-12.5 MG tablet    Sig: Take 1 tablet by mouth daily.    Dispense:  90 tablet    Refill:  3      BP Readings from Last 3 Encounters:  08/02/21 (!) 156/86  01/27/20 126/78  05/22/16 (!) 149/84   Wt Readings from Last 3 Encounters:  08/02/21 216 lb (98 kg)  01/27/20 215 lb (97.5 kg)  04/05/13 195 lb (88.5 kg)    Lab Results  Component Value Date   CHOL 231 (H) 01/27/2020   Lab Results  Component Value Date   HDL 61.40 01/27/2020   Lab Results  Component Value Date   LDLCALC 151 (H) 01/27/2020   Lab Results  Component Value Date   TRIG 94.0 01/27/2020   Lab Results  Component Value Date   CHOLHDL 4 01/27/2020   No results found for: "LDLDIRECT" Lab Results  Component Value Date   CREATININE 0.80 01/27/2020   BUN 15 01/27/2020   NA 137 01/27/2020   K 4.0 01/27/2020   CL 104 01/27/2020   CO2 24 01/27/2020    The 10-year ASCVD risk score (Arnett DK, et al., 2019) is: 6.1%   Values used to calculate the score:     Age: 5 years     Sex: Female     Is Non-Hispanic African American:  No     Diabetic: No     Tobacco smoker: No     Systolic Blood Pressure: 156 mmHg     Is BP treated: Yes     HDL Cholesterol: 61.4 mg/dL     Total Cholesterol: 231 mg/dL  I reviewed the patients updated PMH, FH, and SocHx.    Patient Active Problem List   Diagnosis Date Noted   Family history of colon cancer 01/27/2020   Family history of cervical cancer mother 09/20/2013   Obesity (BMI 30-39.9) 07/21/2013   Mixed hyperlipidemia 09/16/2012    Allergies: Penicillins and Sulfa antibiotics  Social History: Patient  reports that she has never smoked. She has never used smokeless tobacco. She reports current alcohol use. She reports that she does not use  drugs.  Current Meds  Medication Sig   Ascorbic Acid (VITAMIN C PO) Take 1 tablet by mouth daily.   lisinopril-hydrochlorothiazide (ZESTORETIC) 10-12.5 MG tablet Take 1 tablet by mouth daily.   Multiple Vitamin (MULTIVITAMIN) tablet Take 1 tablet by mouth daily.    Review of Systems: Cardiovascular: negative for chest pain, palpitations, leg swelling, orthopnea Respiratory: negative for SOB, wheezing or persistent cough Gastrointestinal: negative for abdominal pain Genitourinary: negative for dysuria or gross hematuria  Objective  Vitals: BP (!) 156/86   Pulse 80   Temp 97.8 F (36.6 C)   Ht 5\' 2"  (1.575 m)   Wt 216 lb (98 kg)   SpO2 96%   BMI 39.51 kg/m  General: no acute distress  Psych:  Alert and oriented, normal mood and affect HEENT:  Normocephalic, atraumatic, supple neck  Cardiovascular:  RRR with 2/6 systolic murmur at RUSB and LUSB. No edema.  Respiratory:  Good breath sounds bilaterally, CTAB with normal respiratory effort Skin:  Warm, no rashes Neurologic:   Mental status is normal, no tremor Commons side effects, risks, benefits, and alternatives for medications and treatment plan prescribed today were discussed, and the patient expressed understanding of the given instructions. Patient is instructed to call or message via MyChart if he/she has any questions or concerns regarding our treatment plan. No barriers to understanding were identified. We discussed Red Flag symptoms and signs in detail. Patient expressed understanding regarding what to do in case of urgent or emergency type symptoms.  Medication list was reconciled, printed and provided to the patient in AVS. Patient instructions and summary information was reviewed with the patient as documented in the AVS. This note was prepared with assistance of Dragon voice recognition software. Occasional wrong-word or sound-a-like substitutions may have occurred due to the inherent limitations of voice recognition  software  This visit occurred during the SARS-CoV-2 public health emergency.  Safety protocols were in place, including screening questions prior to the visit, additional usage of staff PPE, and extensive cleaning of exam room while observing appropriate contact time as indicated for disinfecting solutions.

## 2021-08-02 NOTE — Patient Instructions (Signed)
Please follow up as scheduled for your next visit with me: 11/11/2021   We will call you to get you set up for an ultrasound of your heart.   I will release your lab results to you on your MyChart account with further instructions. You may see the results before I do, but when I review them I will send you a message with my report or have my assistant call you if things need to be discussed. Please reply to my message with any questions. Thank you!   If you have any questions or concerns, please don't hesitate to send me a message via MyChart or call the office at [phone]. Thank you for visiting with [place] today! It's our pleasure caring for you.   How to Take Your Blood Pressure Blood pressure is a measurement of how strongly your blood is pressing against the walls of your arteries. Arteries are blood vessels that carry blood from your heart throughout your body. Your health care provider takes your blood pressure at each office visit. You can also take your own blood pressure at home with a blood pressure monitor. You may need to take your own blood pressure to: Confirm a diagnosis of high blood pressure (hypertension). Monitor your blood pressure over time. Make sure your blood pressure medicine is working. Supplies needed: Blood pressure monitor. A chair to sit in. This should be a chair where you can sit upright with your back supported. Do not sit on a soft couch or an armchair. Table or desk. Small notebook and pencil or pen. How to prepare To get the most accurate reading, avoid the following for 30 minutes before you check your blood pressure: Drinking caffeine. Drinking alcohol. Eating. Smoking. Exercising. Five minutes before you check your blood pressure: Use the bathroom and urinate so that you have an empty bladder. Sit quietly in a chair. Do not talk. How to take your blood pressure To check your blood pressure, follow the instructions in the manual that came with your  blood pressure monitor. If you have a digital blood pressure monitor, the instructions may be as follows: Sit up straight in a chair. Place your feet on the floor. Do not cross your ankles or legs. Rest your left arm at the level of your heart on a table or desk or on the arm of a chair. Pull up your shirt sleeve. Wrap the blood pressure cuff around the upper part of your left arm, 1 inch (2.5 cm) above your elbow. It is best to wrap the cuff around bare skin. Fit the cuff snugly, but not too tightly, around your arm. You should be able to place only one finger between the cuff and your arm. Position the cord so that it rests in the bend of your elbow. Press the power button. Sit quietly while the cuff inflates and deflates. Read the digital reading on the monitor screen and write the numbers down (record them) in a notebook. Wait 2-3 minutes, then repeat the steps, starting at step 1. What does my blood pressure reading mean? A blood pressure reading consists of a higher number over a lower number. Ideally, your blood pressure should be below 120/80. The first ("top") number is called the systolic pressure. It is a measure of the pressure in your arteries as your heart beats. The second ("bottom") number is called the diastolic pressure. It is a measure of the pressure in your arteries as the heart relaxes. Blood pressure is classified into four  stages. The following are the stages for adults who do not have a short-term serious illness or a chronic condition. Systolic pressure and diastolic pressure are measured in a unit called mm Hg (millimeters of mercury).  Normal Systolic pressure: below 120. Diastolic pressure: below 80. Elevated Systolic pressure: 120-129. Diastolic pressure: below 80. Hypertension stage 1 Systolic pressure: 130-139. Diastolic pressure: 80-89. Hypertension stage 2 Systolic pressure: 140 or above. Diastolic pressure: 90 or above. You can have elevated blood  pressure or hypertension even if only the systolic or only the diastolic number in your reading is higher than normal. Follow these instructions at home: Medicines Take over-the-counter and prescription medicines only as told by your health care provider. Tell your health care provider if you are having any side effects from blood pressure medicine. General instructions Check your blood pressure as often as recommended by your health care provider. Check your blood pressure at the same time every day. Take your monitor to the next appointment with your health care provider to make sure that: You are using it correctly. It provides accurate readings. Understand what your goal blood pressure numbers are. Keep all follow-up visits. This is important. General tips Your health care provider can suggest a reliable monitor that will meet your needs. There are several types of home blood pressure monitors. Choose a monitor that has an arm cuff. Do not choose a monitor that measures your blood pressure from your wrist or finger. Choose a cuff that wraps snugly, not too tight or too loose, around your upper arm. You should be able to fit only one finger between your arm and the cuff. You can buy a blood pressure monitor at most drugstores or online. Where to find more information American Heart Association: www.heart.org Contact a health care provider if: Your blood pressure is consistently high. Your blood pressure is suddenly low. Get help right away if: Your systolic blood pressure is higher than 180. Your diastolic blood pressure is higher than 120. These symptoms may be an emergency. Get help right away. Call 911. Do not wait to see if the symptoms will go away. Do not drive yourself to the hospital. Summary Blood pressure is a measurement of how strongly your blood is pressing against the walls of your arteries. A blood pressure reading consists of a higher number over a lower number.  Ideally, your blood pressure should be below 120/80. Check your blood pressure at the same time every day. Avoid caffeine, alcohol, smoking, and exercise for 30 minutes prior to checking your blood pressure. These agents can affect the accuracy of the blood pressure reading. This information is not intended to replace advice given to you by your health care provider. Make sure you discuss any questions you have with your health care provider. Document Revised: 09/20/2020 Document Reviewed: 09/20/2020 Elsevier Patient Education  2023 [employer].

## 2021-08-15 DIAGNOSIS — D124 Benign neoplasm of descending colon: Secondary | ICD-10-CM | POA: Diagnosis not present

## 2021-08-15 DIAGNOSIS — K635 Polyp of colon: Secondary | ICD-10-CM | POA: Diagnosis not present

## 2021-08-15 DIAGNOSIS — Z1211 Encounter for screening for malignant neoplasm of colon: Secondary | ICD-10-CM | POA: Diagnosis not present

## 2021-09-05 ENCOUNTER — Ambulatory Visit (HOSPITAL_COMMUNITY): Payer: 59 | Attending: Cardiology

## 2021-09-05 DIAGNOSIS — R011 Cardiac murmur, unspecified: Secondary | ICD-10-CM

## 2021-09-05 DIAGNOSIS — I10 Essential (primary) hypertension: Secondary | ICD-10-CM | POA: Diagnosis not present

## 2021-09-05 LAB — ECHOCARDIOGRAM COMPLETE
Area-P 1/2: 2.85 cm2
S' Lateral: 2.3 cm

## 2021-09-10 ENCOUNTER — Encounter: Payer: Self-pay | Admitting: Family Medicine

## 2021-09-10 DIAGNOSIS — I358 Other nonrheumatic aortic valve disorders: Secondary | ICD-10-CM | POA: Insufficient documentation

## 2021-09-10 HISTORY — DX: Other nonrheumatic aortic valve disorders: I35.8

## 2021-10-14 ENCOUNTER — Encounter: Payer: Self-pay | Admitting: *Deleted

## 2021-11-07 ENCOUNTER — Ambulatory Visit (INDEPENDENT_AMBULATORY_CARE_PROVIDER_SITE_OTHER): Payer: 59 | Admitting: Family Medicine

## 2021-11-07 ENCOUNTER — Encounter: Payer: Self-pay | Admitting: Family Medicine

## 2021-11-07 VITALS — BP 136/84 | HR 73 | Temp 98.7°F | Ht 62.0 in | Wt 222.2 lb

## 2021-11-07 DIAGNOSIS — Z0000 Encounter for general adult medical examination without abnormal findings: Secondary | ICD-10-CM | POA: Diagnosis not present

## 2021-11-07 DIAGNOSIS — Z800 Family history of malignant neoplasm of digestive organs: Secondary | ICD-10-CM | POA: Diagnosis not present

## 2021-11-07 DIAGNOSIS — I358 Other nonrheumatic aortic valve disorders: Secondary | ICD-10-CM | POA: Diagnosis not present

## 2021-11-07 DIAGNOSIS — I10 Essential (primary) hypertension: Secondary | ICD-10-CM

## 2021-11-07 DIAGNOSIS — Z1231 Encounter for screening mammogram for malignant neoplasm of breast: Secondary | ICD-10-CM | POA: Diagnosis not present

## 2021-11-07 DIAGNOSIS — R7989 Other specified abnormal findings of blood chemistry: Secondary | ICD-10-CM | POA: Diagnosis not present

## 2021-11-07 DIAGNOSIS — E782 Mixed hyperlipidemia: Secondary | ICD-10-CM | POA: Diagnosis not present

## 2021-11-07 LAB — COMPREHENSIVE METABOLIC PANEL
ALT: 29 U/L (ref 0–35)
AST: 21 U/L (ref 0–37)
Albumin: 4.7 g/dL (ref 3.5–5.2)
Alkaline Phosphatase: 51 U/L (ref 39–117)
BUN: 13 mg/dL (ref 6–23)
CO2: 27 mEq/L (ref 19–32)
Calcium: 10.1 mg/dL (ref 8.4–10.5)
Chloride: 102 mEq/L (ref 96–112)
Creatinine, Ser: 0.88 mg/dL (ref 0.40–1.20)
GFR: 71.85 mL/min (ref >60.00)
Glucose, Bld: 87 mg/dL (ref 70–99)
Potassium: 4 mEq/L (ref 3.5–5.1)
Sodium: 138 mEq/L (ref 135–145)
Total Bilirubin: 0.6 mg/dL (ref 0.2–1.2)
Total Protein: 7.4 g/dL (ref 6.0–8.3)

## 2021-11-07 LAB — CBC WITH DIFFERENTIAL/PLATELET
Basophils Absolute: 0 10*3/uL (ref 0.0–0.1)
Basophils Relative: 0.6 % (ref 0.0–3.0)
Eosinophils Absolute: 0.1 10*3/uL (ref 0.0–0.7)
Eosinophils Relative: 1.9 % (ref 0.0–5.0)
HCT: 43.9 % (ref 36.0–46.0)
Hemoglobin: 14.8 g/dL (ref 12.0–15.0)
Lymphocytes Relative: 38.8 % (ref 12.0–46.0)
Lymphs Abs: 2.1 10*3/uL (ref 0.7–4.0)
MCHC: 33.6 g/dL (ref 30.0–36.0)
MCV: 87.9 fl (ref 78.0–100.0)
Monocytes Absolute: 0.3 10*3/uL (ref 0.1–1.0)
Monocytes Relative: 6.2 % (ref 3.0–12.0)
Neutro Abs: 2.8 10*3/uL (ref 1.4–7.7)
Neutrophils Relative %: 52.5 % (ref 43.0–77.0)
Platelets: 252 10*3/uL (ref 150.0–400.0)
RBC: 5 Mil/uL (ref 3.87–5.11)
RDW: 14.4 % (ref 11.5–15.5)
WBC: 5.3 10*3/uL (ref 4.0–10.5)

## 2021-11-07 LAB — LIPID PANEL
Cholesterol: 261 mg/dL — ABNORMAL HIGH (ref 0–200)
HDL: 56.1 mg/dL (ref >39.00)
LDL Cholesterol: 167 mg/dL — ABNORMAL HIGH (ref 0–99)
NonHDL: 205.04
Total CHOL/HDL Ratio: 5
Triglycerides: 190 mg/dL — ABNORMAL HIGH (ref 0.0–149.0)
VLDL: 38 mg/dL (ref 0.0–40.0)

## 2021-11-07 MED ORDER — OLMESARTAN MEDOXOMIL-HCTZ 20-12.5 MG PO TABS: 1.0000 | ORAL_TABLET | Freq: Every day | ORAL | 3 refills | Status: DC

## 2021-11-07 NOTE — Patient Instructions (Addendum)
Please return in 3 months to recheck blood pressure  I will release your lab results to you on your MyChart account with further instructions. You may see the results before I do, but when I review them I will send you a message with my report or have my assistant call you if things need to be discussed. Please reply to my message with any questions. Thank you!   If you have any questions or concerns, please don't hesitate to send me a message via MyChart or call the office at [phone]. Thank you for visiting with [place] today! It's our pleasure caring for you.   I have ordered a mammogram and/or bone density for you as we discussed today: [x]   Mammogram  []   Bone Density  Please call the office checked below to schedule your appointment:  [x]   [facility]      [place], [place]         []   [facility]  [place], [place]  Please do these things to maintain good health!  Exercise at least 30-45 minutes a day,  4-5 days a week.  Eat a low-fat diet with lots of fruits and vegetables, up to 7-9 servings per day. Drink plenty of water daily. Try to drink 8 8oz glasses per day. Seatbelts can save your life. Always wear your seatbelt. Place Smoke Detectors on every level of your home and check batteries every year. Schedule an appointment with an eye doctor for an eye exam every 1-2 years Safe sex - use condoms to protect yourself from STDs if you could be exposed to these types of infections. Use birth control if you do not want to become pregnant and are sexually active. Avoid heavy alcohol use. If you drink, keep it to less than 2 drinks/day and not every day. [place].  Choose someone you trust that could speak for you if you became unable to speak for yourself. Depression is common in our stressful world.If you're feeling down or losing interest in things  you normally enjoy, please come in for a visit. If anyone is threatening or hurting you, please get help. Physical or Emotional Violence is never OK.

## 2021-11-07 NOTE — Progress Notes (Signed)
Subjective  Chief Complaint  Patient presents with   Hypertension   Annual Exam    Pt here for Annual exam and is currently fasting     HPI: Alexis Schmidt is a 61 y.o. female who presents to [facility] [place] today for a Female Wellness Visit. She also has the concerns and/or needs as listed above in the chief complaint. These will be addressed in addition to the Health Maintenance Visit.   Wellness Visit: annual visit with health maintenance review and exam without Pap  HM: Due for mammogram.  Reports had colonoscopy last month with Dr. Elnoria Howard.  Small benign polyp, every 5 year surveillance due to family history.  Pap smear current.  Eligible for COVID booster.  Immunizations are up-to-date.   Chronic disease f/u and/or acute problem visit: (deemed necessary to be done in addition to the wellness visit): Hypertension: Medicines have been adjusted 3 months ago, lisinopril HCTZ, low-dose.  However developed significant daily irritant cough.  Likely side effect from ACE inhibitor.  Has been checking blood pressure with her home cuff, blood pressures at home remain very elevated 150s to 160s over 90s to 100s.  However today in the office, checked twice, blood pressure has been normal.  Suspect cuff is inaccurate.  No chest pain or palpitations or shortness of breath or lower extremity edema.  Reviewed echocardiogram: Grade 1 diastolic dysfunction, mild aortic sclerosis.  Otherwise normal. Hyperlipidemia: Due for recheck.  Has been working on a healthier diet.  No family history of cardiovascular disease.  Not on medications. Morbid obesity: Was seeing a weight loss specialist with her work.  Was on Ozempic, titrate up to 1 mg weekly, however could not tolerate the side effects.  She lost over 20 pounds, but since being off she has regained weight.  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  Fasts until noon, salads for lunch and then eats  dinner.  Dinner is variable.  Would like to lose weight.  No symptoms of hypoglycemia. History of elevated LFTs by chart review over the last 3 years.  No right upper quadrant pain.  Has not been evaluated in the past.  Assessment  1. Annual physical exam   2. Essential hypertension   3. Aortic valve sclerosis - heart murmur   4. Family history of colon cancer   5. Mixed hyperlipidemia   6. Morbid obesity (HCC)   7. Elevated liver function tests   8. Encounter for screening mammogram for breast cancer      Plan  Female Wellness Visit: Age appropriate Health Maintenance and Prevention measures were discussed with patient. Included topics are cancer screening recommendations, ways to keep healthy (see AVS) including dietary and exercise recommendations, regular eye and dental care, use of seat belts, and avoidance of moderate alcohol use and tobacco use.  Mammogram to be scheduled by patient BMI: discussed patient's BMI and encouraged positive lifestyle modifications to help get to or maintain a target BMI. HM needs and immunizations were addressed and ordered. See below for orders. See HM and immunization section for updates.  Up-to-date Routine labs and screening tests ordered including cmp, cbc and lipids where appropriate. Discussed recommendations regarding Vit D and calcium supplementation (see AVS)  Chronic disease management visit and/or acute problem visit: Hypertension: Good control by our office readings.  But intolerant to ACE.  Changed to Benicar HCT and recheck in the office in 3 months.  She will bring  her cough.  Low-salt diet.  Weight loss recommended.  Check renal function. Hyperlipidemia: Recheck fasting levels today.  Offer cholesterol-lowering medications if indicated.  Low-fat diet recommended. Elevated LFTs: Recheck today.  Screen for infections and other causes.  Possibly related to fatty liver.  Education given.  May warrant imaging study. Morbid obesity: Had a long  talk diet, exercise regimens and how to improve.  She will consider returning to weight loss clinic.  Trial of Mounjaro.  Follow up: 3 months to recheck blood pressure Orders Placed This Encounter  Procedures   MM DIGITAL SCREENING BILATERAL   Comprehensive metabolic panel   Lipid panel   CBC with Differential/Platelet   Hepatitis B surface antigen   Hepatitis C antibody   Hepatitis B core antibody, total   Hepatitis B surface antibody,qualitative   ANA   Anti-smooth muscle antibody, IgG   Meds ordered this encounter  Medications   olmesartan-hydrochlorothiazide (BENICAR HCT) 20-12.5 MG tablet    Sig: Take 1 tablet by mouth daily.    Dispense:  90 tablet    Refill:  3      Body mass index is 40.64 kg/m. Wt Readings from Last 3 Encounters:  11/07/21 222 lb 3.2 oz (100.8 kg)  08/02/21 216 lb (98 kg)  01/27/20 215 lb (97.5 kg)     Patient Active Problem List   Diagnosis Date Noted   Essential hypertension 11/07/2021    Priority: High   Family history of colon cancer 01/27/2020    Priority: High    grandparent    Morbid obesity (HCC) 07/21/2013    Priority: High   Mixed hyperlipidemia 09/16/2012    Priority: High   Family history of cervical cancer mother 09/20/2013    Priority: Medium      Mother    Aortic valve sclerosis - heart murmur 09/10/2021    Priority: Low    Echocardiogram 08/2021; no regurg. Nl mitral valve. Nl EF.     Health Maintenance  Topic Date Due   MAMMOGRAM  05/28/2021   COVID-19 Vaccine (3 - Pfizer series) 11/23/2021 (Originally 12/16/2019)   PAP SMEAR-Modifier  01/21/2023   TETANUS/TDAP  07/14/2025   COLONOSCOPY (Pts 45-3yrs Insurance coverage will need to be confirmed)  09/21/2031   INFLUENZA VACCINE  Completed   Hepatitis C Screening  Completed   HIV Screening  Completed   Zoster Vaccines- Shingrix  Completed   HPV VACCINES  Aged Out   Immunization History  Administered Date(s) Administered   Influenza Split 09/20/2012    Influenza, Seasonal, Injecte, Preservative Fre 09/20/2013, 10/25/2015   Influenza,inj,Quad PF,6+ Mos 11/03/2021   Influenza-Unspecified 10/21/2019   PFIZER(Purple Top)SARS-COV-2 Vaccination 09/27/2019, 10/21/2019   Tdap 07/15/2015   Zoster Recombinat (Shingrix) 10/21/2019, 03/15/2020   We updated and reviewed the patient's past history in detail and it is documented below. Allergies: Patient is allergic to ace inhibitors, penicillins, and sulfa antibiotics. Past Medical History Patient  has a past medical history of Aortic valve sclerosis (09/10/2021), Hyperlipidemia, and Hypertension. Past Surgical History Patient  has a past surgical history that includes Foot surgery; Bunionectomy (Bilateral, 2000, 2001); Cholecystectomy; Tonsillectomy; and Eye surgery. Family History: Patient family history includes Cervical cancer in her mother; Colon cancer in her maternal grandmother; Heart disease in an other family member. Social History:  Patient  reports that she has never smoked. She has never used smokeless tobacco. She reports current alcohol use. She reports that she does not use drugs.  Review of Systems: Constitutional: negative for fever or  malaise Ophthalmic: negative for photophobia, double vision or loss of vision Cardiovascular: negative for chest pain, dyspnea on exertion, or new LE swelling Respiratory: negative for SOB or persistent cough Gastrointestinal: negative for abdominal pain, change in bowel habits or melena Genitourinary: negative for dysuria or gross hematuria, no abnormal uterine bleeding or disharge Musculoskeletal: negative for new gait disturbance or muscular weakness Integumentary: negative for new or persistent rashes, no breast lumps Neurological: negative for TIA or stroke symptoms Psychiatric: negative for SI or delusions Allergic/Immunologic: negative for hives  Patient Care Team    Relationship Specialty Notifications Start End  Leamon Arnt, MD PCP  - General Family Medicine  01/27/20   Carol Ada, MD Consulting Physician Gastroenterology  01/27/20   Aloha Gell, MD Consulting Physician Obstetrics and Gynecology  01/27/20     Objective  Vitals: BP 136/84   Pulse 73   Temp 98.7 F (37.1 C)   Ht 5\' 2"  (1.575 m)   Wt 222 lb 3.2 oz (100.8 kg)   SpO2 95%   BMI 40.64 kg/m  General:  Well developed, well nourished, no acute distress  Psych:  Alert and orientedx3,normal mood and affect HEENT:  Normocephalic, atraumatic, non-icteric sclera,  supple neck without adenopathy, mass or thyromegaly Cardiovascular:  Normal S1, S2, RRR without gallop, rub or murmur Respiratory:  Good breath sounds bilaterally, CTAB with normal respiratory effort Gastrointestinal: normal bowel sounds, soft, non-tender, no noted masses. No HSM MSK: no deformities, contusions. Joints are without erythema or swelling.  Skin:  Warm, no rashes or suspicious lesions noted Neurologic:    Mental status is normal. CN 2-11 are normal. Gross motor and sensory exams are normal. Normal gait. No tremor   Commons side effects, risks, benefits, and alternatives for medications and treatment plan prescribed today were discussed, and the patient expressed understanding of the given instructions. Patient is instructed to call or message via MyChart if he/she has any questions or concerns regarding our treatment plan. No barriers to understanding were identified. We discussed Red Flag symptoms and signs in detail. Patient expressed understanding regarding what to do in case of urgent or emergency type symptoms.  Medication list was reconciled, printed and provided to the patient in AVS. Patient instructions and summary information was reviewed with the patient as documented in the AVS. This note was prepared with assistance of Dragon voice recognition software. Occasional wrong-word or sound-a-like substitutions may have occurred due to the inherent limitations of voice recognition  software

## 2021-11-08 DIAGNOSIS — H5213 Myopia, bilateral: Secondary | ICD-10-CM | POA: Diagnosis not present

## 2021-11-10 LAB — HEPATITIS C ANTIBODY: Hepatitis C Ab: NONREACTIVE

## 2021-11-10 LAB — ANTI-NUCLEAR AB-TITER (ANA TITER): ANA Titer 1: 1:80 {titer} — ABNORMAL HIGH

## 2021-11-10 LAB — HEPATITIS B CORE ANTIBODY, TOTAL: Hep B Core Total Ab: NONREACTIVE

## 2021-11-10 LAB — ANA: Anti Nuclear Antibody (ANA): POSITIVE — AB

## 2021-11-10 LAB — HEPATITIS B SURFACE ANTIBODY,QUALITATIVE: Hep B S Ab: REACTIVE — AB

## 2021-11-10 LAB — ANTI-SMOOTH MUSCLE ANTIBODY, IGG: Actin (Smooth Muscle) Antibody (IGG): <20 U (ref <20)

## 2021-11-10 LAB — HEPATITIS B SURFACE ANTIGEN: Hepatitis B Surface Ag: NONREACTIVE

## 2021-11-11 ENCOUNTER — Encounter: Payer: No Typology Code available for payment source | Admitting: Family Medicine

## 2021-11-12 NOTE — Addendum Note (Signed)
Addended by: Billey Chang on: 11/12/2021 01:19 PM   Modules accepted: Orders

## 2021-11-29 ENCOUNTER — Ambulatory Visit
Admission: RE | Admit: 2021-11-29 | Discharge: 2021-11-29 | Disposition: A | Discharge status: Home / Self Care | Payer: 59 | Source: Ambulatory Visit | Attending: Family Medicine | Admitting: Family Medicine

## 2021-11-29 DIAGNOSIS — Z9049 Acquired absence of other specified parts of digestive tract: Secondary | ICD-10-CM | POA: Diagnosis not present

## 2021-11-29 DIAGNOSIS — Z1231 Encounter for screening mammogram for malignant neoplasm of breast: Secondary | ICD-10-CM

## 2021-11-29 DIAGNOSIS — R945 Abnormal results of liver function studies: Secondary | ICD-10-CM | POA: Diagnosis not present

## 2021-11-29 DIAGNOSIS — R7989 Other specified abnormal findings of blood chemistry: Secondary | ICD-10-CM

## 2021-12-02 ENCOUNTER — Encounter: Payer: Self-pay | Admitting: Family Medicine

## 2021-12-02 DIAGNOSIS — K760 Fatty (change of) liver, not elsewhere classified: Secondary | ICD-10-CM | POA: Insufficient documentation

## 2022-02-07 ENCOUNTER — Ambulatory Visit (INDEPENDENT_AMBULATORY_CARE_PROVIDER_SITE_OTHER): Payer: 59 | Admitting: Family Medicine

## 2022-02-07 ENCOUNTER — Encounter: Payer: Self-pay | Admitting: Family Medicine

## 2022-02-07 VITALS — BP 164/80 | HR 70 | Temp 98.1°F | Ht 62.0 in | Wt 228.6 lb

## 2022-02-07 DIAGNOSIS — K760 Fatty (change of) liver, not elsewhere classified: Secondary | ICD-10-CM

## 2022-02-07 DIAGNOSIS — I10 Essential (primary) hypertension: Secondary | ICD-10-CM

## 2022-02-07 MED ORDER — OLMESARTAN MEDOXOMIL-HCTZ 40-12.5 MG PO TABS: 1.0000 | ORAL_TABLET | Freq: Every day | ORAL | 3 refills | Status: DC

## 2022-02-07 NOTE — Patient Instructions (Signed)
Please return in 6 months for hypertension follow up.   If you have any questions or concerns, please don't hesitate to send me a message via MyChart or call the office at [phone]. Thank you for visiting with [place] today! It's our pleasure caring for you.

## 2022-02-07 NOTE — Progress Notes (Signed)
Subjective  CC:  Chief Complaint  Patient presents with   Hypertension    HPI: Alexis Schmidt is a 62 y.o. female who presents to the office today to address the problems listed above in the chief complaint. Hypertension f/u: Control is fair . No longer with ACE cough. Now on benicar hct 20/12.5 and tolerating well. Home bp 130s/80s. Pt reports she is doing well. taking medications as instructed, no medication side effects noted, no TIAs, no chest pain on exertion, no dyspnea on exertion, no swelling of ankles. She denies adverse effects from his BP medications. Compliance with medication is good.  Hepatic steatosis: nl lfts and nl workup. Reviewed labs. Ultrasound c/w fatty liver. Low fat diet recommended. Obesity: working on diet. Inquires about oral glp-1. Had been on Ozempic/semaglutide: had to stop due to diarrhea. 2023. Was very effective for weight loss. Would consider zepbound trial in future once covered by insurance. Is changing up exercise program.   Assessment  1. Essential hypertension   2. Hepatic steatosis   3. Morbid obesity ([place])      Plan   Hypertension f/u: BP control is fairly well controlled by home readings but elevated in office today. Will increase benicar hct to 40/12.5 and monitor at home. Recheck 6 months. Sooner if getting elevated readings at home. Stable renal function by labs Fatty liver: low fat diet and weight loss Obesity: continue diet and exercise. Consider glp-1 zebpound in future.   Education regarding management of these chronic disease states was given. Management strategies discussed on successive visits include dietary and exercise recommendations, goals of achieving and maintaining IBW, and lifestyle modifications aiming for adequate sleep and minimizing stressors.   Follow up: 6 mo for bp recheck  No orders of the defined types were placed in this encounter.  Meds ordered this encounter  Medications   olmesartan-hydrochlorothiazide  (BENICAR HCT) 40-12.5 MG tablet    Sig: Take 1 tablet by mouth daily.    Dispense:  90 tablet    Refill:  3      BP Readings from Last 3 Encounters:  02/07/22 (!) 164/80  11/07/21 136/84  08/02/21 (!) 156/86   Wt Readings from Last 3 Encounters:  02/07/22 228 lb 9.6 oz (103.7 kg)  11/07/21 222 lb 3.2 oz (100.8 kg)  08/02/21 216 lb (98 kg)    Lab Results  Component Value Date   CHOL 261 (H) 11/07/2021   CHOL 231 (H) 01/27/2020   Lab Results  Component Value Date   HDL 56.10 11/07/2021   HDL 61.40 01/27/2020   Lab Results  Component Value Date   LDLCALC 167 (H) 11/07/2021   LDLCALC 151 (H) 01/27/2020   Lab Results  Component Value Date   TRIG 190.0 (H) 11/07/2021   TRIG 94.0 01/27/2020   Lab Results  Component Value Date   CHOLHDL 5 11/07/2021   CHOLHDL 4 01/27/2020   No results found for: "LDLDIRECT" Lab Results  Component Value Date   CREATININE 0.88 11/07/2021   BUN 13 11/07/2021   NA 138 11/07/2021   K 4.0 11/07/2021   CL 102 11/07/2021   CO2 27 11/07/2021    The 10-year ASCVD risk score (Arnett DK, et al., 2019) is: 8%   Values used to calculate the score:     Age: 27 years     Sex: Female     Is Non-Hispanic African American: No     Diabetic: No     Tobacco smoker: No  Systolic Blood Pressure: 164 mmHg     Is BP treated: Yes     HDL Cholesterol: 56.1 mg/dL     Total Cholesterol: 261 mg/dL  I reviewed the patients updated PMH, FH, and SocHx.    Patient Active Problem List   Diagnosis Date Noted   Essential hypertension 11/07/2021    Priority: High   Family history of colon cancer 01/27/2020    Priority: High   Morbid obesity (HCC) 07/21/2013    Priority: High   Mixed hyperlipidemia 09/16/2012    Priority: High   Hepatic steatosis 12/02/2021    Priority: Medium    Family history of cervical cancer mother 09/20/2013    Priority: Medium    Aortic valve sclerosis - heart murmur 09/10/2021    Priority: Low    Allergies: Ace  inhibitors, Penicillins, and Sulfa antibiotics  Social History: Patient  reports that she has never smoked. She has never used smokeless tobacco. She reports current alcohol use. She reports that she does not use drugs.  Current Meds  Medication Sig   Ascorbic Acid (VITAMIN C PO) Take 1 tablet by mouth daily.   Multiple Vitamin (MULTIVITAMIN) tablet Take 1 tablet by mouth daily.   olmesartan-hydrochlorothiazide (BENICAR HCT) 40-12.5 MG tablet Take 1 tablet by mouth daily.   [DISCONTINUED] olmesartan-hydrochlorothiazide (BENICAR HCT) 20-12.5 MG tablet Take 1 tablet by mouth daily.    Review of Systems: Cardiovascular: negative for chest pain, palpitations, leg swelling, orthopnea Respiratory: negative for SOB, wheezing or persistent cough Gastrointestinal: negative for abdominal pain Genitourinary: negative for dysuria or gross hematuria  Objective  Vitals: BP (!) 164/80   Pulse 70   Temp 98.1 F (36.7 C)   Ht 5\' 2"  (1.575 m)   Wt 228 lb 9.6 oz (103.7 kg)   SpO2 96%   BMI 41.81 kg/m  General: no acute distress   No visits with results within 1 Day(s) from this visit.  Latest known visit with results is:  Office Visit on 11/07/2021  Component Date Value Ref Range Status   Sodium 11/07/2021 138  135 - 145 mEq/L Final   Potassium 11/07/2021 4.0  3.5 - 5.1 mEq/L Final   Chloride 11/07/2021 102  96 - 112 mEq/L Final   CO2 11/07/2021 27  19 - 32 mEq/L Final   Glucose, Bld 11/07/2021 87  70 - 99 mg/dL Final   BUN 11/09/2021 13  6 - 23 mg/dL Final   Creatinine, Ser 11/07/2021 0.88  0.40 - 1.20 mg/dL Final   Total Bilirubin 11/07/2021 0.6  0.2 - 1.2 mg/dL Final   Alkaline Phosphatase 11/07/2021 51  39 - 117 U/L Final   AST 11/07/2021 21  0 - 37 U/L Final   ALT 11/07/2021 29  0 - 35 U/L Final   Total Protein 11/07/2021 7.4  6.0 - 8.3 g/dL Final   Albumin 11/09/2021 4.7  3.5 - 5.2 g/dL Final   GFR 46/96/2952 71.85  >60.00 mL/min Final   Calcium 11/07/2021 10.1  8.4 - 10.5 mg/dL  Final   Cholesterol 11/09/2021 261 (H)  0 - 200 mg/dL Final   Triglycerides 11/16/2534 190.0 (H)  0.0 - 149.0 mg/dL Final   HDL 64/40/3474 56.10  >39.00 mg/dL Final   VLDL 25/95/6387 38.0  0.0 - 40.0 mg/dL Final   LDL Cholesterol 11/07/2021 167 (H)  0 - 99 mg/dL Final   Total CHOL/HDL Ratio 11/07/2021 5   Final   NonHDL 11/07/2021 205.04   Final   WBC 11/07/2021 5.3  4.0 - 10.5 K/uL Final   RBC 11/07/2021 5.00  3.87 - 5.11 Mil/uL Final   Hemoglobin 11/07/2021 14.8  12.0 - 15.0 g/dL Final   HCT 11/07/2021 43.9  36.0 - 46.0 % Final   MCV 11/07/2021 87.9  78.0 - 100.0 fl Final   MCHC 11/07/2021 33.6  30.0 - 36.0 g/dL Final   RDW 11/07/2021 14.4  11.5 - 15.5 % Final   Platelets 11/07/2021 252.0  150.0 - 400.0 K/uL Final   Neutrophils Relative % 11/07/2021 52.5  43.0 - 77.0 % Final   Lymphocytes Relative 11/07/2021 38.8  12.0 - 46.0 % Final   Monocytes Relative 11/07/2021 6.2  3.0 - 12.0 % Final   Eosinophils Relative 11/07/2021 1.9  0.0 - 5.0 % Final   Basophils Relative 11/07/2021 0.6  0.0 - 3.0 % Final   Neutro Abs 11/07/2021 2.8  1.4 - 7.7 K/uL Final   Lymphs Abs 11/07/2021 2.1  0.7 - 4.0 K/uL Final   Monocytes Absolute 11/07/2021 0.3  0.1 - 1.0 K/uL Final   Eosinophils Absolute 11/07/2021 0.1  0.0 - 0.7 K/uL Final   Basophils Absolute 11/07/2021 0.0  0.0 - 0.1 K/uL Final   Hepatitis B Surface Ag 11/07/2021 NON-REACTIVE  NON-REACTIVE Final   Hepatitis C Ab 11/07/2021 NON-REACTIVE  NON-REACTIVE Final   Hep B Core Total Ab 11/07/2021 NON-REACTIVE  NON-REACTIVE Final   Hep B S Ab 11/07/2021 REACTIVE (A)  NON-REACTIVE Final   Anti Nuclear Antibody (ANA) 11/07/2021 POSITIVE (A)  NEGATIVE Final   Actin (Smooth Muscle) Antibody (IG* 11/07/2021 <20  <20 U Final   ANA Titer 1 11/07/2021 1:80 (H)  titer Final   ANA Pattern 1 11/07/2021 Nuclear, Nucleolar (A)   Final    Commons side effects, risks, benefits, and alternatives for medications and treatment plan prescribed today were discussed,  and the patient expressed understanding of the given instructions. Patient is instructed to call or message via MyChart if he/she has any questions or concerns regarding our treatment plan. No barriers to understanding were identified. We discussed Red Flag symptoms and signs in detail. Patient expressed understanding regarding what to do in case of urgent or emergency type symptoms.  Medication list was reconciled, printed and provided to the patient in AVS. Patient instructions and summary information was reviewed with the patient as documented in the AVS. This note was prepared with assistance of Dragon voice recognition software. Occasional wrong-word or sound-a-like substitutions may have occurred due to the inherent limitation

## 2022-02-19 DIAGNOSIS — Z6841 Body Mass Index (BMI) 40.0 and over, adult: Secondary | ICD-10-CM | POA: Diagnosis not present

## 2022-02-19 DIAGNOSIS — H66002 Acute suppurative otitis media without spontaneous rupture of ear drum, left ear: Secondary | ICD-10-CM | POA: Diagnosis not present

## 2022-02-19 DIAGNOSIS — I10 Essential (primary) hypertension: Secondary | ICD-10-CM | POA: Diagnosis not present

## 2022-03-14 ENCOUNTER — Ambulatory Visit (INDEPENDENT_AMBULATORY_CARE_PROVIDER_SITE_OTHER): Payer: 59 | Admitting: Family Medicine

## 2022-03-14 VITALS — BP 128/80 | HR 67 | Temp 98.6°F | Ht 62.0 in | Wt 228.8 lb

## 2022-03-14 DIAGNOSIS — J301 Allergic rhinitis due to pollen: Secondary | ICD-10-CM

## 2022-03-14 DIAGNOSIS — R0982 Postnasal drip: Secondary | ICD-10-CM | POA: Diagnosis not present

## 2022-03-14 DIAGNOSIS — J029 Acute pharyngitis, unspecified: Secondary | ICD-10-CM

## 2022-03-14 MED ORDER — FLUTICASONE PROPIONATE 50 MCG/ACT NA SUSP: 1.0000 | Freq: Every day | NASAL | 6 refills | Status: AC

## 2022-03-14 NOTE — Progress Notes (Unsigned)
Subjective  CC:  Chief Complaint  Patient presents with   Sore Throat    Pt has had a sore throat since 02/19/2022 and has gotten worse and it is hard to swallow    HPI: Alexis Schmidt is a 62 y.o. female who presents to the office today to address the problems listed above in the chief complaint. Reports ongoing ST x over a month. Admits to congestion and PND> no f/c/s, headaches, cough or sinus pain. No major h/o allergies. No meds.    Assessment  1. Sore throat   2. PND (post-nasal drip)   3. Seasonal allergic rhinitis due to pollen      Plan  ST due to allergies:  start zyrtec, flonase and monitor for improvement. R/o mono  Follow up: prn  08/08/2022  Orders Placed This Encounter  Procedures   Epstein-Barr virus VCA antibody panel   Meds ordered this encounter  Medications   fluticasone (FLONASE) 50 MCG/ACT nasal spray    Sig: Place 1 spray into both nostrils daily.    Dispense:  16 g    Refill:  6      I reviewed the patients updated PMH, FH, and SocHx.    Patient Active Problem List   Diagnosis Date Noted   Essential hypertension 11/07/2021    Priority: High   Family history of colon cancer 01/27/2020    Priority: High   Morbid obesity ([place]) 07/21/2013    Priority: High   Mixed hyperlipidemia 09/16/2012    Priority: High   Hepatic steatosis 12/02/2021    Priority: Medium    Family history of cervical cancer mother 09/20/2013    Priority: Medium    Aortic valve sclerosis - heart murmur 09/10/2021    Priority: Low   Current Meds  Medication Sig   Ascorbic Acid (VITAMIN C PO) Take 1 tablet by mouth daily.   fluticasone (FLONASE) 50 MCG/ACT nasal spray Place 1 spray into both nostrils daily.   Multiple Vitamin (MULTIVITAMIN) tablet Take 1 tablet by mouth daily.   olmesartan-hydrochlorothiazide (BENICAR HCT) 40-12.5 MG tablet Take 1 tablet by mouth daily.    Allergies: Patient is allergic to ace inhibitors, penicillins, and sulfa  antibiotics. Family History: Patient family history includes Cervical cancer in her mother; Colon cancer in her maternal grandmother; Heart disease in an other family member. Social History:  Patient  reports that she has never smoked. She has never used smokeless tobacco. She reports current alcohol use. She reports that she does not use drugs.  Review of Systems: Constitutional: Negative for fever malaise or anorexia Cardiovascular: negative for chest pain Respiratory: negative for SOB or persistent cough Gastrointestinal: negative for abdominal pain  Objective  Vitals: BP 128/80   Pulse 67   Temp 98.6 F (37 C)   Ht '5\' 2"'$  (1.575 m)   Wt 228 lb 12.8 oz (103.8 kg)   SpO2 95%   BMI 41.85 kg/m  General: no acute distress , A&Ox3 HEENT: PEERL, conjunctiva normal, neck is supple, no LAD, + erythematous posterior pharynx w/o exudate Cardiovascular:  RRR without murmur or gallop.  Respiratory:  Good breath sounds bilaterally, CTAB with normal respiratory effort Skin:  Warm, no rashes  Commons side effects, risks, benefits, and alternatives for medications and treatment plan prescribed today were discussed, and the patient expressed understanding of the given instructions. Patient is instructed to call or message via MyChart if he/she has any questions or concerns regarding our treatment plan. No barriers to understanding were  identified. We discussed Red Flag symptoms and signs in detail. Patient expressed understanding regarding what to do in case of urgent or emergency type symptoms.  Medication list was reconciled, printed and provided to the patient in AVS. Patient instructions and summary information was reviewed with the patient as documented in the AVS. This note was prepared with assistance of Dragon voice recognition software. Occasional wrong-word or sound-a-like substitutions may have occurred due to the inherent limitations of voice recognition software

## 2022-03-14 NOTE — Patient Instructions (Signed)
Please follow up as scheduled for your next visit with me: 08/08/2022   If you have any questions or concerns, please don't hesitate to send me a message via MyChart or call the office at [phone]. Thank you for visiting with Alexis Schmidt today! It's our pleasure caring for you.   Start allegra or zyrtec daily.  Start flonase daily. Use ibuprofen as needed for sore throat. I will let you know about your mono test results.   Pharyngitis  Pharyngitis is inflammation of the throat (pharynx). It is a very common cause of sore throat. Pharyngitis can be caused by a bacteria, but it is usually caused by a virus. Most cases of pharyngitis get better on their own without treatment. What are the causes? This condition may be caused by: Infection by viruses (viral). Viral pharyngitis spreads easily from person to person (is contagious) through coughing, sneezing, and sharing of personal items or utensils such as cups, forks, spoons, and toothbrushes. Infection by bacteria (bacterial). Bacterial pharyngitis may be spread by touching the nose or face after coming in contact with the bacteria, or through close contact, such as kissing. Allergies. Allergies can cause buildup of mucus in the throat (post-nasal drip), leading to inflammation and irritation. Allergies can also cause blocked nasal passages, forcing breathing through the mouth, which dries and irritates the throat. What increases the risk? You are more likely to develop this condition if: You are 62-40 years old. You are exposed to crowded environments such as daycare, school, or dormitory living. You live in a cold climate. You have a weakened disease-fighting (immune) system. What are the signs or symptoms? Symptoms of this condition vary by the cause. Common symptoms of this condition include: Sore throat. Fatigue. Low-grade fever. Stuffy nose (nasal congestion) and cough. Headache. Other symptoms may include: Glands in the neck (lymph nodes)  that are swollen. Skin rashes. Plaque-like film on the throat or tonsils. This is often a symptom of bacterial pharyngitis. Vomiting. Red, itchy eyes (conjunctivitis). Loss of appetite. Joint pain and muscle aches. Enlarged tonsils. How is this diagnosed? This condition may be diagnosed based on your medical history and a physical exam. Your health care provider will ask you questions about your illness and your symptoms. A swab of your throat may be done to check for bacteria (rapid strep test). Other lab tests may also be done, depending on the suspected cause, but these are rare. How is this treated? Many times, treatment is not needed for this condition. Pharyngitis usually gets better in 3-4 days without treatment. Bacterial pharyngitis may be treated with antibiotic medicines. Follow these instructions at home: Medicines Take over-the-counter and prescription medicines only as told by your health care provider. If you were prescribed an antibiotic medicine, take it as told by your health care provider. Do not stop taking the antibiotic even if you start to feel better. Use throat sprays to soothe your throat as told by your health care provider. Children can get pharyngitis. Do not give your child aspirin because of the association with Reye's syndrome. Managing pain To help with pain, try: Sipping warm liquids, such as broth, herbal tea, or warm water. Eating or drinking cold or frozen liquids, such as frozen ice pops. Gargling with a mixture of salt and water 3-4 times a day or as needed. To make salt water, completely dissolve -1 tsp (3-6 g) of salt in 1 cup (237 mL) of warm water. Sucking on hard candy or throat lozenges. Putting a cool-mist humidifier in  your bedroom at night to moisten the air. Sitting in the bathroom with the door closed for 5-10 minutes while you run hot water in the shower.  General instructions  Do not use any products that contain nicotine or tobacco.  These products include cigarettes, chewing tobacco, and vaping devices, such as e-cigarettes. If you need help quitting, ask your health care provider. Rest as told by your health care provider. Drink enough fluid to keep your urine pale yellow. How is this prevented? To help prevent becoming infected or spreading infection: Wash your hands often with soap and water for at least 20 seconds. If soap and water are not available, use hand sanitizer. Do not touch your eyes, nose, or mouth with unwashed hands, and wash hands after touching these areas. Do not share cups or eating utensils. Avoid close contact with people who are sick. Contact a health care provider if: You have large, tender lumps in your neck. You have a rash. You cough up green, yellow-brown, or bloody mucus. Get help right away if: Your neck becomes stiff. You drool or are unable to swallow liquids. You cannot drink or take medicines without vomiting. You have severe pain that does not go away, even after you take medicine. You have trouble breathing, and it is not caused by a stuffy nose. You have new pain and swelling in your joints such as the knees, ankles, wrists, or elbows. These symptoms may represent a serious problem that is an emergency. Do not wait to see if the symptoms will go away. Get medical help right away. Call your local emergency services (911 in the [place] Do not drive yourself to the hospital. Summary Pharyngitis is redness, pain, and swelling (inflammation) of the throat (pharynx). While pharyngitis can be caused by a bacteria, the most common causes are viral. Most cases of pharyngitis get better on their own without treatment. Bacterial pharyngitis is treated with antibiotic medicines. This information is not intended to replace advice given to you by your health care provider. Make sure you discuss any questions you have with your health care provider. Document Revised: 04/04/2020 Document Reviewed:  04/04/2020 Elsevier Patient Education  [place].

## 2022-03-15 LAB — EPSTEIN-BARR VIRUS VCA ANTIBODY PANEL
EBV NA IgG: <18 U/mL
EBV VCA IgG: 640 U/mL — ABNORMAL HIGH
EBV VCA IgM: <36 U/mL

## 2022-06-01 DIAGNOSIS — N3001 Acute cystitis with hematuria: Secondary | ICD-10-CM | POA: Diagnosis not present

## 2022-06-21 ENCOUNTER — Emergency Department (HOSPITAL_BASED_OUTPATIENT_CLINIC_OR_DEPARTMENT_OTHER)
Admission: EM | Admit: 2022-06-21 | Discharge: 2022-06-21 | Disposition: A | Discharge status: Home / Self Care | Payer: 59 | Attending: Emergency Medicine | Admitting: Emergency Medicine

## 2022-06-21 ENCOUNTER — Encounter (HOSPITAL_BASED_OUTPATIENT_CLINIC_OR_DEPARTMENT_OTHER): Payer: Self-pay | Admitting: Emergency Medicine

## 2022-06-21 DIAGNOSIS — Z79899 Other long term (current) drug therapy: Secondary | ICD-10-CM | POA: Insufficient documentation

## 2022-06-21 DIAGNOSIS — L509 Urticaria, unspecified: Secondary | ICD-10-CM | POA: Insufficient documentation

## 2022-06-21 DIAGNOSIS — R21 Rash and other nonspecific skin eruption: Secondary | ICD-10-CM | POA: Diagnosis present

## 2022-06-21 DIAGNOSIS — I10 Essential (primary) hypertension: Secondary | ICD-10-CM | POA: Insufficient documentation

## 2022-06-21 NOTE — ED Triage Notes (Signed)
Rash on arms chest torso. Itchy, red raised. Started around 4:30pm Took Claritin at home. Denies tightness, itchy ness of throat, airway. Denies contact with allergin or new products

## 2022-06-21 NOTE — Discharge Instructions (Signed)
Take Benadryl, 50 mg (2 tablets) every 6 hours as needed for control of your symptoms.

## 2022-06-21 NOTE — ED Provider Notes (Signed)
[facility] Provider Note: Alexis Dell, MD, FACEP  CSN: [id number] MRN: [id number] ARRIVAL: 06/21/22 at 2042 ROOM: DB014/DB014   CHIEF COMPLAINT  Rash   HISTORY OF PRESENT ILLNESS  06/21/22 10:54 PM Alexis Schmidt is a 62 y.o. female who developed a rash about 4:30 PM.  It started with burning in her palms and then proceeded to develop into a rash that went up her arms and involves her trunk and ultimately most of her body.  The rash has been evanescent and migratory.  It is pruritic.  There was no associated throat swelling, difficulty breathing, nausea, vomiting or diarrhea.  She took Claritin and the rash has nearly resolved on her trunk but she still has it present on her upper extremities and thighs.  She does not know what might of triggered this.  She is not using any new products.   Past Medical History:  Diagnosis Date   Aortic valve sclerosis 09/10/2021   Echocardiogram 08/2021; no regurg. Nl mitral valve. Nl EF.    Hyperlipidemia    Hypertension     Past Surgical History:  Procedure Laterality Date   BUNIONECTOMY Bilateral 2000, 2001   CHOLECYSTECTOMY     EYE SURGERY     FOOT SURGERY     TONSILLECTOMY      Family History  Problem Relation Age of Onset   Cervical cancer Mother    Colon cancer Maternal Grandmother    Heart disease Other    Breast cancer Neg Hx     Social History   Tobacco Use   Smoking status: Never   Smokeless tobacco: Never  Substance Use Topics   Alcohol use: Yes    Alcohol/week: 0.0 standard drinks of alcohol   Drug use: Never    Prior to Admission medications   Medication Sig Start Date End Date Taking? Authorizing Provider  Ascorbic Acid (VITAMIN C PO) Take 1 tablet by mouth daily.    [provider]  fluticasone (FLONASE) 50 MCG/ACT nasal spray Place 1 spray into both nostrils daily. 03/14/22   Willow Ora, MD  Multiple Vitamin (MULTIVITAMIN) tablet Take 1 tablet by mouth daily.    [provider]  olmesartan-hydrochlorothiazide (BENICAR HCT) 40-12.5 MG tablet Take 1 tablet by mouth daily. 02/07/22   Willow Ora, MD    Allergies Ace inhibitors, Penicillins, and Sulfa antibiotics   REVIEW OF SYSTEMS  Negative except as noted here or in the History of Present Illness.   PHYSICAL EXAMINATION  Initial Vital Signs Blood pressure 139/77, pulse 98, temperature 98.5 F (36.9 C), resp. rate 18, SpO2 98 %.  Examination General: Well-developed, well-nourished female in no acute distress; appearance consistent with age of record HENT: normocephalic; atraumatic; pharynx normal Eyes: Normal appearance Neck: supple Heart: regular rate and rhythm Lungs: clear to auscultation bilaterally Abdomen: soft; nondistended; nontender; bowel sounds present Extremities: No deformity; full range of motion; pulses normal Neurologic: Awake, alert and oriented; motor function intact in all extremities and symmetric; no facial droop Skin: Warm and dry; fading urticarial rash of the upper extremities and thighs Psychiatric: Normal mood and affect   RESULTS  Summary of this visit's results, reviewed and interpreted by myself:   EKG Interpretation  Date/Time:    Ventricular Rate:    PR Interval:    QRS Duration:   QT Interval:    QTC Calculation:   R Axis:     Text Interpretation:         Laboratory Studies: No results  found for this or any previous visit (from the past 24 hour(s)). Imaging Studies: No results found.  ED COURSE and MDM  Nursing notes, initial and subsequent vitals signs, including pulse oximetry, reviewed and interpreted by myself.  Vitals:   06/21/22 2056  BP: 139/77  Pulse: 98  Resp: 18  Temp: 98.5 F (36.9 C)  SpO2: 98%   Medications - No data to display  The rash appears to be urticaria and this would be consistent with its evanescent's and migratory nature.  She was advised to take Benadryl 50 mg every 6 hours as needed.  I do not believe  she needs steroids at this time as the reaction is limited to hives.  PROCEDURES  Procedures   ED DIAGNOSES     ICD-10-CM   1. Urticaria  L50.9          Lylla Eifler, MD 06/21/22 2302

## 2022-07-23 DIAGNOSIS — L649 Androgenic alopecia, unspecified: Secondary | ICD-10-CM | POA: Diagnosis not present

## 2022-08-08 ENCOUNTER — Ambulatory Visit (INDEPENDENT_AMBULATORY_CARE_PROVIDER_SITE_OTHER): Payer: 59 | Admitting: Family Medicine

## 2022-08-08 DIAGNOSIS — L659 Nonscarring hair loss, unspecified: Secondary | ICD-10-CM | POA: Diagnosis not present

## 2022-08-08 DIAGNOSIS — R439 Unspecified disturbances of smell and taste: Secondary | ICD-10-CM | POA: Diagnosis not present

## 2022-08-08 DIAGNOSIS — I10 Essential (primary) hypertension: Secondary | ICD-10-CM | POA: Diagnosis not present

## 2022-08-20 NOTE — Progress Notes (Signed)
Please see handwritten documentation below.  Epic was unavailable during office visit due to global IT outage through [employer].    Media Information   Document Information  Photos    08/20/2022 16:23  Attached To:  Office Visit on 08/08/22 with Willow Ora, MD  Source Information  Willow Ora, MD  [facility]  Document History     Media Information   Document Information  Photos    08/20/2022 16:23  Attached To:  Office Visit on 08/08/22 with Willow Ora, MD  Source Information  Willow Ora, MD  [facility]  Document History

## 2022-11-07 ENCOUNTER — Encounter: Payer: Self-pay | Admitting: Family Medicine

## 2022-11-07 ENCOUNTER — Other Ambulatory Visit: Payer: Self-pay | Admitting: Family Medicine

## 2022-11-07 ENCOUNTER — Ambulatory Visit: Payer: 59 | Admitting: Family Medicine

## 2022-11-07 VITALS — BP 132/78 | HR 70 | Temp 98.2°F | Ht 62.0 in | Wt 222.0 lb

## 2022-11-07 DIAGNOSIS — K635 Polyp of colon: Secondary | ICD-10-CM

## 2022-11-07 DIAGNOSIS — Z0001 Encounter for general adult medical examination with abnormal findings: Secondary | ICD-10-CM

## 2022-11-07 DIAGNOSIS — Z0000 Encounter for general adult medical examination without abnormal findings: Secondary | ICD-10-CM | POA: Diagnosis not present

## 2022-11-07 DIAGNOSIS — I10 Essential (primary) hypertension: Secondary | ICD-10-CM | POA: Diagnosis not present

## 2022-11-07 DIAGNOSIS — Z8049 Family history of malignant neoplasm of other genital organs: Secondary | ICD-10-CM | POA: Diagnosis not present

## 2022-11-07 DIAGNOSIS — E782 Mixed hyperlipidemia: Secondary | ICD-10-CM | POA: Diagnosis not present

## 2022-11-07 DIAGNOSIS — Z800 Family history of malignant neoplasm of digestive organs: Secondary | ICD-10-CM | POA: Diagnosis not present

## 2022-11-07 DIAGNOSIS — H5460 Unqualified visual loss, one eye, unspecified: Secondary | ICD-10-CM

## 2022-11-07 DIAGNOSIS — Z23 Encounter for immunization: Secondary | ICD-10-CM

## 2022-11-07 DIAGNOSIS — K760 Fatty (change of) liver, not elsewhere classified: Secondary | ICD-10-CM | POA: Diagnosis not present

## 2022-11-07 DIAGNOSIS — Z1231 Encounter for screening mammogram for malignant neoplasm of breast: Secondary | ICD-10-CM

## 2022-11-07 LAB — COMPREHENSIVE METABOLIC PANEL
ALT: 26 U/L (ref 0–35)
AST: 23 U/L (ref 0–37)
Albumin: 4.4 g/dL (ref 3.5–5.2)
Alkaline Phosphatase: 53 U/L (ref 39–117)
BUN: 15 mg/dL (ref 6–23)
CO2: 26 meq/L (ref 19–32)
Calcium: 9.9 mg/dL (ref 8.4–10.5)
Chloride: 103 meq/L (ref 96–112)
Creatinine, Ser: 0.81 mg/dL (ref 0.40–1.20)
GFR: 78.81 mL/min (ref >60.00)
Glucose, Bld: 76 mg/dL (ref 70–99)
Potassium: 3.9 meq/L (ref 3.5–5.1)
Sodium: 139 meq/L (ref 135–145)
Total Bilirubin: 0.4 mg/dL (ref 0.2–1.2)
Total Protein: 7.3 g/dL (ref 6.0–8.3)

## 2022-11-07 LAB — CBC WITH DIFFERENTIAL/PLATELET
Basophils Absolute: 0 10*3/uL (ref 0.0–0.1)
Basophils Relative: 0.7 % (ref 0.0–3.0)
Eosinophils Absolute: 0.1 10*3/uL (ref 0.0–0.7)
Eosinophils Relative: 1.6 % (ref 0.0–5.0)
HCT: 41.9 % (ref 36.0–46.0)
Hemoglobin: 13.8 g/dL (ref 12.0–15.0)
Lymphocytes Relative: 41 % (ref 12.0–46.0)
Lymphs Abs: 2.3 10*3/uL (ref 0.7–4.0)
MCHC: 33 g/dL (ref 30.0–36.0)
MCV: 89 fL (ref 78.0–100.0)
Monocytes Absolute: 0.3 10*3/uL (ref 0.1–1.0)
Monocytes Relative: 5.8 % (ref 3.0–12.0)
Neutro Abs: 2.9 10*3/uL (ref 1.4–7.7)
Neutrophils Relative %: 50.9 % (ref 43.0–77.0)
Platelets: 280 10*3/uL (ref 150.0–400.0)
RBC: 4.71 Mil/uL (ref 3.87–5.11)
RDW: 13.7 % (ref 11.5–15.5)
WBC: 5.6 10*3/uL (ref 4.0–10.5)

## 2022-11-07 LAB — LIPID PANEL
Cholesterol: 236 mg/dL — ABNORMAL HIGH (ref 0–200)
HDL: 54.9 mg/dL (ref >39.00)
LDL Cholesterol: 151 mg/dL — ABNORMAL HIGH (ref 0–99)
NonHDL: 181.28
Total CHOL/HDL Ratio: 4
Triglycerides: 152 mg/dL — ABNORMAL HIGH (ref 0.0–149.0)
VLDL: 30.4 mg/dL (ref 0.0–40.0)

## 2022-11-07 LAB — HEMOGLOBIN A1C: Hgb A1c MFr Bld: 5.7 % (ref 4.6–6.5)

## 2022-11-07 LAB — TSH: TSH: 1.14 u[IU]/mL (ref 0.35–5.50)

## 2022-11-07 MED ORDER — AMLODIPINE BESYLATE 10 MG PO TABS: 10.0000 mg | ORAL_TABLET | Freq: Every day | ORAL | 3 refills | Status: DC

## 2022-11-07 NOTE — Patient Instructions (Signed)

## 2022-11-07 NOTE — Progress Notes (Signed)
Subjective  Chief Complaint  Patient presents with   Annual Exam   Hyperlipidemia   Hypertension    HPI: Alexis Schmidt is a 62 y.o. female who presents to [facility] today for a Female Wellness Visit. She also has the concerns and/or needs as listed above in the chief complaint. These will be addressed in addition to the Health Maintenance Visit.   Wellness Visit: annual visit with health maintenance review and exam  Health maintenance: Colon cancer screening is current.  He had benign polyp.  Gets surveillance every 5 years due to family history.  Sees Dr. Ernestina Penna for GYN, due for GYN visit with Pap smear and mammogram.  Patient to schedule.  Eye exam is current.  Reviewed history of monocular vision loss.  See problem list.  Flu shot updated today Chronic disease f/u and/or acute problem visit: (deemed necessary to be done in addition to the wellness visit): Hypertension: In July we increased medication for blood pressure control.  Adding amlodipine 5 mg to Benicar HCT.  She is tolerated well.  Home readings averaging 130s over 60s.  No adverse effects.  No chest pain or palpitations. Hyperlipidemia: She had coffee with creamer and sugar today.  Due for recheck.  Not taking medications.  Following low-fat diet. Hepatic steatosis with mild right upper quadrant pain.  Need to monitor LFTs.  No jaundice. Obesity: Failed tirzepatide due to diarrhea.  Failed semaglutide for same.  Still eating healthy diet.  Assessment  1. Encounter for well adult exam with abnormal findings   2. Need for influenza vaccination   3. Essential hypertension   4. Mixed hyperlipidemia   5. Morbid obesity (HCC)   6. Family history of cervical cancer mother   30. Hepatic steatosis   8. Family history of colon cancer   9. 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 Included topics are cancer screening recommendations, ways to keep healthy (see AVS) including dietary and exercise recommendations, regular eye and dental care, use of seat belts, and avoidance of moderate alcohol use and tobacco use.  Patient to schedule mammogram and Pap smear with GYN.  Both now due BMI: discussed patient's BMI and encouraged positive lifestyle modifications to help get to or maintain a target BMI. HM needs and immunizations were addressed and ordered. See below for orders. See HM and immunization section for updates.  Flu shot today Routine labs and screening tests ordered including cmp, cbc and lipids where appropriate. Discussed recommendations regarding Vit D and calcium supplementation (see AVS)  Chronic disease management visit and/or acute problem visit: Hypertension: Fairly good control but will increase amlodipine to 10 mg daily continue Benicar HCT.  Goal blood pressure 120s over 60s or 70s.  Check renal function and electrolytes. Monitoring lipids.  May warrant statin Obesity: Continue diet and exercise.  Failed GLP-1's due to side effects Hepatic steatosis: Monitor LFTs.  Follow up: 6 months for blood pressure recheck Orders Placed This Encounter  Procedures   Flu vaccine trivalent PF, 6mos and older(Flulaval,Afluria,Fluarix,Fluzone)   CBC with Differential/Platelet   Comprehensive metabolic panel   Lipid panel   Hemoglobin A1c   TSH   Meds ordered this encounter  Medications   amLODipine (NORVASC) 10 MG tablet    Sig: Take 1 tablet (10 mg total) by mouth daily.  Dispense:  90 tablet    Refill:  3      Body mass index is 40.6 kg/m. Wt Readings from Last 3 Encounters:  11/07/22 222 lb (100.7 kg)  03/14/22 228 lb 12.8 oz (103.8 kg)  02/07/22 228 lb 9.6 oz (103.7 kg)     Patient Active Problem List   Diagnosis Date Noted Date Diagnosed   Essential hypertension 11/07/2021      Priority: High   Family history of colon cancer 01/27/2020     Priority: High    grandparent    Morbid obesity (HCC) 07/21/2013     Priority: High    Ozempic/semaglutide: had to stop due to diarrhea. 2023. Was very effective for weight loss. Would consider zepbound trial in future once covered by insurance (discussed 01/2022)    Mixed hyperlipidemia 09/16/2012     Priority: High   Polyp of colon 11/07/2022     Priority: Medium    Hepatic steatosis 12/02/2021     Priority: Medium     Liver ultrasound 10/2021; intermittent lft elevation w/ negative lab eval.    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    Aortic valve sclerosis - heart murmur 09/10/2021     Priority: Low    Echocardiogram 08/2021; no regurg. Nl mitral valve. Nl EF.     Health Maintenance  Topic Date Due   Cervical Cancer Screening (HPV/Pap Cotest)  06/18/2020   COVID-19 Vaccine (3 - 2023-24 season) 11/23/2022 (Originally 09/21/2022)   MAMMOGRAM  11/30/2022   DTaP/Tdap/Td (2 - Td or Tdap) 07/14/2025   Colonoscopy  09/21/2031   INFLUENZA VACCINE  Completed   Hepatitis C Screening  Completed   HIV Screening  Completed   Zoster Vaccines- Shingrix  Completed   HPV VACCINES  Aged Out   Immunization History  Administered Date(s) Administered   Influenza Split 09/20/2012   Influenza, Seasonal, Injecte, Preservative Fre 09/20/2013, 10/25/2015, 11/07/2022   Influenza,inj,Quad PF,6+ Mos 11/03/2021   Influenza-Unspecified 10/21/2019   PFIZER(Purple Top)SARS-COV-2 Vaccination 09/27/2019, 10/21/2019   Tdap 07/15/2015   Zoster Recombinant(Shingrix) 10/21/2019, 03/15/2020   We updated and reviewed the patient's past history in detail and it is documented below. Allergies: Patient is allergic to ace  inhibitors, penicillins, and sulfa antibiotics. Past Medical History Patient  has a past medical history of Aortic valve sclerosis (09/10/2021), Hyperlipidemia, and Hypertension. Past Surgical History Patient  has a past surgical history that includes Foot surgery; Bunionectomy (Bilateral, 2000, 2001); Cholecystectomy; Tonsillectomy; and Eye surgery. Family History: Patient family history includes Cervical cancer in her mother; Colon cancer in her maternal grandmother; Heart disease in an other family member. Social History:  Patient  reports that she has never smoked. She has never used smokeless tobacco. She reports current alcohol use. She reports that she does not use drugs.  Review of Systems: Constitutional: negative for fever or malaise Ophthalmic: negative for photophobia, double vision or loss of vision Cardiovascular: negative for chest pain, dyspnea on exertion, or new LE swelling Respiratory: negative for SOB or persistent cough Gastrointestinal: negative for abdominal pain, change in bowel habits or melena Genitourinary: negative for dysuria or gross hematuria, no abnormal uterine bleeding or disharge Musculoskeletal: negative for new gait disturbance or muscular  weakness Integumentary: negative for new or persistent rashes, no breast lumps Neurological: negative for TIA or stroke symptoms Psychiatric: negative for SI or delusions Allergic/Immunologic: negative for hives  Patient Care Team    Relationship Specialty Notifications Start End  Willow Ora, MD PCP - General Family Medicine  01/27/20   Jeani Hawking, MD Consulting Physician Gastroenterology  01/27/20   Noland Fordyce, MD Consulting Physician Obstetrics and Gynecology  01/27/20     Objective  Vitals: BP 132/78   Pulse 70   Temp 98.2 F (36.8 C)   Ht 5\' 2"  (1.575 m)   Wt 222 lb (100.7 kg)   SpO2 98%   BMI 40.60 kg/m  General:  Well developed, well nourished, no acute distress  Psych:  Alert and  orientedx3,normal mood and affect HEENT:  Normocephalic, atraumatic, non-icteric sclera,  supple neck without adenopathy, mass or thyromegaly Cardiovascular:  Normal S1, S2, RRR without gallop, rub or murmur Respiratory:  Good breath sounds bilaterally, CTAB with normal respiratory effort Gastrointestinal: normal bowel sounds, soft, non-tender, no noted masses. No HSM MSK: extremities without edema, joints without erythema or swelling Neurologic:    Mental status is normal.  Gross motor and sensory exams are normal.  No tremor  Commons side effects, risks, benefits, and alternatives for medications and treatment plan prescribed today were discussed, and the patient expressed understanding of the given instructions. Patient is instructed to call or message via MyChart if he/she has any questions or concerns regarding our treatment plan. No barriers to understanding were identified. We discussed Red Flag symptoms and signs in detail. Patient expressed understanding regarding what to do in case of urgent or emergency type symptoms.  Medication list was reconciled, printed and provided to the patient in AVS. Patient instructions and summary information was reviewed with the patient as documented in the AVS. This note was prepared with assistance of Dragon voice recognition software. Occasional wrong-word or sound-a-like substitutions may have occurred due to the inherent limitations of voice recognition software

## 2022-11-08 NOTE — Progress Notes (Signed)
Labs reviewed.  The 10-year ASCVD risk score (Arnett DK, et al., 2019) is: 5.4%   Values used to calculate the score:     Age: 62 years     Sex: Female     Is Non-Hispanic African American: No     Diabetic: No     Tobacco smoker: No     Systolic Blood Pressure: 132 mmHg     Is BP treated: Yes     HDL Cholesterol: 54.9 mg/dL     Total Cholesterol: 236 mg/dL

## 2022-11-14 DIAGNOSIS — H524 Presbyopia: Secondary | ICD-10-CM | POA: Diagnosis not present

## 2022-12-05 ENCOUNTER — Ambulatory Visit
Admission: RE | Admit: 2022-12-05 | Discharge: 2022-12-05 | Disposition: A | Discharge status: Home / Self Care | Payer: 59 | Source: Ambulatory Visit | Attending: Family Medicine | Admitting: Family Medicine

## 2022-12-05 DIAGNOSIS — Z1231 Encounter for screening mammogram for malignant neoplasm of breast: Secondary | ICD-10-CM

## 2022-12-05 DIAGNOSIS — Z0100 Encounter for examination of eyes and vision without abnormal findings: Secondary | ICD-10-CM | POA: Diagnosis not present

## 2023-02-01 ENCOUNTER — Other Ambulatory Visit: Payer: Self-pay | Admitting: Family Medicine

## 2023-02-06 DIAGNOSIS — Z113 Encounter for screening for infections with a predominantly sexual mode of transmission: Secondary | ICD-10-CM | POA: Diagnosis not present

## 2023-02-06 DIAGNOSIS — Z1331 Encounter for screening for depression: Secondary | ICD-10-CM | POA: Diagnosis not present

## 2023-02-06 DIAGNOSIS — Z01419 Encounter for gynecological examination (general) (routine) without abnormal findings: Secondary | ICD-10-CM | POA: Diagnosis not present

## 2023-02-06 DIAGNOSIS — Z124 Encounter for screening for malignant neoplasm of cervix: Secondary | ICD-10-CM | POA: Diagnosis not present

## 2023-02-06 DIAGNOSIS — Z0142 Encounter for cervical smear to confirm findings of recent normal smear following initial abnormal smear: Secondary | ICD-10-CM | POA: Diagnosis not present

## 2023-02-06 DIAGNOSIS — N841 Polyp of cervix uteri: Secondary | ICD-10-CM | POA: Diagnosis not present

## 2023-02-06 DIAGNOSIS — Z01411 Encounter for gynecological examination (general) (routine) with abnormal findings: Secondary | ICD-10-CM | POA: Diagnosis not present

## 2023-02-06 DIAGNOSIS — N898 Other specified noninflammatory disorders of vagina: Secondary | ICD-10-CM | POA: Diagnosis not present

## 2023-02-06 LAB — HM PAP SMEAR

## 2023-03-23 ENCOUNTER — Encounter: Payer: Self-pay | Admitting: Family Medicine

## 2023-04-03 ENCOUNTER — Encounter: Payer: Self-pay | Admitting: Family Medicine

## 2023-04-03 ENCOUNTER — Ambulatory Visit (INDEPENDENT_AMBULATORY_CARE_PROVIDER_SITE_OTHER): Admitting: Family Medicine

## 2023-04-03 DIAGNOSIS — K760 Fatty (change of) liver, not elsewhere classified: Secondary | ICD-10-CM

## 2023-04-03 DIAGNOSIS — I10 Essential (primary) hypertension: Secondary | ICD-10-CM | POA: Diagnosis not present

## 2023-04-03 MED ORDER — WEGOVY 0.25 MG/0.5ML ~~LOC~~ SOAJ: 0.5000 mg | SUBCUTANEOUS | 2 refills | Status: AC

## 2023-04-03 NOTE — Progress Notes (Signed)
 Subjective  CC:  Chief Complaint  Patient presents with   Weight Loss    Pt needs to discuss some options with weight loss    HPI: Alexis Schmidt is a 63 y.o. female who presents to the office today to address the problems listed above in the chief complaint. Discussed the use of AI scribe software for clinical note transcription with the patient, who gave verbal consent to proceed.  History of Present Illness   Alexis Schmidt is a 63 year old female who presents with concerns about weight gain and previous side effects from weight loss medication.  She has been experiencing weight gain despite maintaining a consistent exercise routine for the past three years, which includes strength training four days a week. Recently, she added cardio sessions and an extra leg day. Her diet is generally good, but she struggles with maintaining it over the weekends.  She previously used semaglutide from a compounded pharmacy for weight loss but discontinued it due to side effects, including diarrhea. She believes these side effects may have been exacerbated by her diet and the absence of her gallbladder. During her time on semaglutide from July to November, she lost 25 pounds.  Her insurance covers [id number], another form of semaglutide, which she is considering trying again. She is open to exploring weight loss medications and is aware of the potential side effects, including gastrointestinal issues and pancreatitis. She has failed > 6months of diet/exercise/supervised plans.   She is concerned about hair thinning, which she attributes to weight loss, and mentions taking a multivitamin daily. She has previously taken biotin but stopped after reading conflicting information about its effects.     She has htn which makes phentermine not a good option. She has hepatic steatosis as well. Weight loss would be beneficial in treating both of these conditions  Assessment  1. Morbid obesity (HCC)   2.  Essential hypertension   3. Hepatic steatosis      Plan  Assessment and Plan    Obesity Weight gain despite exercise and good diet. Open to weight loss medications. Insurance covers [employer]. GLP-1 inhibitors beneficial for weight loss. Previously lost 25 pounds on semaglutide. Important to prevent muscle loss during weight loss. - Prescribe Wegovy (semaglutide) starting at 0.25 mg weekly for 4-6 weeks, then increase to 0.5 mg if tolerated. - Provided samples of Wegovy (6 week starter pen) - Schedule follow-up in 2-3 months to assess weight loss and tolerance. - Monitor for side effects, especially gastrointestinal issues and signs of pancreatitis. - Encourage continued strength training and adequate protein intake to prevent muscle loss. - Discuss potential benefits of biotin and zinc for hair thinning.     F/u 3 months No orders of the defined types were placed in this encounter.  Meds ordered this encounter  Medications   WEGOVY 0.25 MG/0.5ML SOAJ    Sig: Inject 0.5 mg into the skin once a week.    Dispense:  2 mL    Refill:  2     I reviewed the patients updated PMH, FH, and SocHx.    Patient Active Problem List   Diagnosis Date Noted   Essential hypertension 11/07/2021    Priority: High   Family history of colon cancer 01/27/2020    Priority: High   Morbid obesity (HCC) 07/21/2013    Priority: High   Mixed hyperlipidemia 09/16/2012    Priority: High   Polyp of colon 11/07/2022    Priority: Medium    Hepatic steatosis  12/02/2021    Priority: Medium    Family history of cervical cancer mother 09/20/2013    Priority: Medium    Monocular vision loss 11/07/2022    Priority: Low   Aortic valve sclerosis - heart murmur 09/10/2021    Priority: Low   Current Meds  Medication Sig   amLODipine (NORVASC) 10 MG tablet Take 1 tablet (10 mg total) by mouth daily.   Ascorbic Acid (VITAMIN C PO) Take 1 tablet by mouth daily.   Calcium Carb-Cholecalciferol (OYSTER SHELL CALCIUM  W/D) 500-5 MG-MCG TABS Take by mouth.   dutasteride (AVODART) 0.5 MG capsule Take 0.5 mg by mouth daily.   fluticasone (FLONASE) 50 MCG/ACT nasal spray Place 1 spray into both nostrils daily.   Multiple Vitamin (MULTIVITAMIN) tablet Take 1 tablet by mouth daily.   olmesartan-hydrochlorothiazide (BENICAR HCT) 40-12.5 MG tablet TAKE 1 TABLET BY MOUTH EVERY DAY   WEGOVY 0.25 MG/0.5ML SOAJ Inject 0.5 mg into the skin once a week.    Allergies: Patient is allergic to ace inhibitors, penicillins, and sulfa antibiotics. Family History: Patient family history includes Cervical cancer in her mother; Colon cancer in her maternal grandmother; Heart disease in an other family member. Social History:  Patient  reports that she has never smoked. She has never used smokeless tobacco. She reports current alcohol use. She reports that she does not use drugs.  Review of Systems: Constitutional: Negative for fever malaise or anorexia Cardiovascular: negative for chest pain Respiratory: negative for SOB or persistent cough Gastrointestinal: negative for abdominal pain  Objective  Vitals: BP 123/76   Pulse 69   Temp 98.2 F (36.8 C)   Ht 5\' 2"  (1.575 m)   Wt 227 lb 6.4 oz (103.1 kg)   SpO2 97%   BMI 41.59 kg/m  General: no acute distress , A&Ox3   Commons side effects, risks, benefits, and alternatives for medications and treatment plan prescribed today were discussed, and the patient expressed understanding of the given instructions. Patient is instructed to call or message via MyChart if he/she has any questions or concerns regarding our treatment plan. No barriers to understanding were identified. We discussed Red Flag symptoms and signs in detail. Patient expressed understanding regarding what to do in case of urgent or emergency type symptoms.  Medication list was reconciled, printed and provided to the patient in AVS. Patient instructions and summary information was reviewed with the patient as  documented in the AVS. This note was prepared with assistance of Dragon voice recognition software. Occasional wrong-word or sound-a-like substitutions may have occurred due to the inherent limitations of voice recognition software

## 2023-04-03 NOTE — Patient Instructions (Signed)
 Please return in 3 months   If you have any questions or concerns, please don't hesitate to send me a message via MyChart or call the office at [phone]. Thank you for visiting with [place] today! It's our pleasure caring for you.   VISIT SUMMARY:  Today, we discussed your concerns about weight gain and previous side effects from weight loss medication. You have been maintaining a consistent exercise routine and a generally good diet but have still experienced weight gain. We reviewed your history with semaglutide and your interest in trying [facility], another form of the medication. We also addressed your concerns about hair thinning.  YOUR PLAN:  -OBESITY: Obesity is a condition characterized by excessive body fat that increases the risk of health problems. We will start you on Wegovy (semaglutide) at 0.25 mg weekly for 4-6 weeks, then increase to 0.5 mg if tolerated. This medication can help with weight loss, but it's important to monitor for side effects like gastrointestinal issues and pancreatitis. Continue your strength training and ensure adequate protein intake to prevent muscle loss. We also discussed the potential benefits of biotin and zinc for your hair thinning.  INSTRUCTIONS:  Please schedule a follow-up appointment in 2-3 months to assess your weight loss progress and how well you are tolerating the medication. Continue with your current exercise routine and diet, and monitor for any side effects from the medication.

## 2023-06-02 ENCOUNTER — Telehealth: Payer: Self-pay

## 2023-06-02 ENCOUNTER — Other Ambulatory Visit (HOSPITAL_COMMUNITY): Payer: Self-pay

## 2023-06-02 ENCOUNTER — Encounter: Payer: Self-pay | Admitting: Family Medicine

## 2023-06-02 NOTE — Telephone Encounter (Signed)
 Pharmacy Patient Advocate Encounter   Received notification from Patient Pharmacy that prior authorization for Wegovy  is required/requested.   Insurance verification completed.   The patient is insured through [facility] .   Per test claim: PA required; PA submitted to above mentioned insurance via CoverMyMeds Key/confirmation #/EOC [id number] Status is pending

## 2023-06-03 ENCOUNTER — Other Ambulatory Visit (HOSPITAL_COMMUNITY): Payer: Self-pay

## 2023-06-03 NOTE — Telephone Encounter (Signed)
 Pharmacy Patient Advocate Encounter  Received notification from [facility] that Prior Authorization for Wegovy  0.25MG /0.5ML has been APPROVED from 06/02/2023 to 01/02/2024   PA #/Case ID/Reference #: [id number] AN

## 2023-06-05 NOTE — Telephone Encounter (Signed)
 error

## 2023-07-06 ENCOUNTER — Ambulatory Visit: Admitting: Family Medicine

## 2023-08-06 ENCOUNTER — Emergency Department (HOSPITAL_BASED_OUTPATIENT_CLINIC_OR_DEPARTMENT_OTHER)

## 2023-08-06 ENCOUNTER — Emergency Department (HOSPITAL_BASED_OUTPATIENT_CLINIC_OR_DEPARTMENT_OTHER): Admitting: Radiology

## 2023-08-06 ENCOUNTER — Emergency Department (HOSPITAL_BASED_OUTPATIENT_CLINIC_OR_DEPARTMENT_OTHER)
Admission: EM | Admit: 2023-08-06 | Discharge: 2023-08-06 | Disposition: A | Discharge status: Home / Self Care | Attending: Emergency Medicine | Admitting: Emergency Medicine

## 2023-08-06 ENCOUNTER — Encounter (HOSPITAL_BASED_OUTPATIENT_CLINIC_OR_DEPARTMENT_OTHER): Payer: Self-pay

## 2023-08-06 ENCOUNTER — Other Ambulatory Visit: Payer: Self-pay

## 2023-08-06 DIAGNOSIS — Z79899 Other long term (current) drug therapy: Secondary | ICD-10-CM | POA: Insufficient documentation

## 2023-08-06 DIAGNOSIS — M25572 Pain in left ankle and joints of left foot: Secondary | ICD-10-CM

## 2023-08-06 DIAGNOSIS — I10 Essential (primary) hypertension: Secondary | ICD-10-CM | POA: Diagnosis not present

## 2023-08-06 DIAGNOSIS — M79672 Pain in left foot: Secondary | ICD-10-CM | POA: Insufficient documentation

## 2023-08-06 MED ORDER — DOXYCYCLINE HYCLATE 100 MG PO CAPS: 100.0000 mg | ORAL_CAPSULE | Freq: Two times a day (BID) | ORAL | 0 refills | Status: AC

## 2023-08-06 MED ORDER — DOXYCYCLINE HYCLATE 100 MG PO TABS
100.0000 mg | ORAL_TABLET | Freq: Once | ORAL | Status: AC
  Administered 2023-08-06: 100 mg via ORAL
  Filled 2023-08-06: qty 1

## 2023-08-06 MED ORDER — PREDNISONE 20 MG PO TABS: ORAL_TABLET | ORAL | 0 refills | Status: AC

## 2023-08-06 MED ORDER — PREDNISONE 50 MG PO TABS
60.0000 mg | ORAL_TABLET | Freq: Once | ORAL | Status: AC
  Administered 2023-08-06: 60 mg via ORAL
  Filled 2023-08-06: qty 1

## 2023-08-06 NOTE — ED Provider Notes (Signed)
 [place] EMERGENCY DEPARTMENT AT [facility] Provider Note   CSN: [id number] Arrival date & time: 08/06/23  1725     Patient presents with: Leg Pain (Right/) and Foot Pain (left)   Alexis Schmidt is a 63 y.o. female.   Patient is a 63 year old female with a history of hypertension who presents with pain and swelling of her left foot.  She said she woke up this morning with it.  It hurts to walk on it.  She denies any known injuries.  She went to urgent care and they were concerned about a DVT and sent her here for further evaluation.  She denies any prior history of joint issues.  No history of gout.  She has had a prior fracture of that foot requiring a screw.  This was back in 2002.  No fevers.  She did have some cramping in her right calf earlier today.  Still little bit sore now.  She denies any leg swelling on the right side.       Prior to Admission medications   Medication Sig Start Date End Date Taking? Authorizing Provider  doxycycline  (VIBRAMYCIN ) 100 MG capsule Take 1 capsule (100 mg total) by mouth 2 (two) times daily. One po bid x 7 days 08/06/23  Yes Lenor Hollering, MD  predniSONE  (DELTASONE ) 20 MG tablet 2 tabs po daily x 4 days 08/06/23  Yes Lenor Hollering, MD  amLODipine  (NORVASC ) 10 MG tablet Take 1 tablet (10 mg total) by mouth daily. 11/07/22   Jodie Lavern CROME, MD  Ascorbic Acid (VITAMIN C PO) Take 1 tablet by mouth daily.    [provider]  Calcium Carb-Cholecalciferol (OYSTER SHELL CALCIUM W/D) 500-5 MG-MCG TABS Take by mouth.    [provider]  dutasteride (AVODART) 0.5 MG capsule Take 0.5 mg by mouth daily.    [provider]  fluticasone  (FLONASE ) 50 MCG/ACT nasal spray Place 1 spray into both nostrils daily. 03/14/22   Jodie Lavern CROME, MD  Multiple Vitamin (MULTIVITAMIN) tablet Take 1 tablet by mouth daily.    [provider]  olmesartan -hydrochlorothiazide  (BENICAR  HCT) 40-12.5 MG tablet TAKE 1 TABLET BY MOUTH  EVERY DAY 02/02/23   Jodie Lavern CROME, MD  WEGOVY  0.25 MG/0.5ML SOAJ Inject 0.5 mg into the skin once a week. 04/03/23   Jodie Lavern CROME, MD    Allergies: Ace inhibitors, Penicillins, and Sulfa antibiotics    Review of Systems  Constitutional:  Negative for fever.  Gastrointestinal:  Negative for nausea and vomiting.  Musculoskeletal:  Positive for arthralgias and joint swelling. Negative for back pain and neck pain.  Skin:  Negative for wound.  Neurological:  Negative for weakness, numbness and headaches.    Updated Vital Signs BP 127/78   Pulse 85   Temp 98.4 F (36.9 C) (Oral)   Resp 20   Ht 5' 2 (1.575 m)   Wt 99.8 kg   SpO2 96%   BMI 40.24 kg/m   Physical Exam Constitutional:      Appearance: She is well-developed.  HENT:     Head: Normocephalic and atraumatic.  Cardiovascular:     Rate and Rhythm: Normal rate.  Pulmonary:     Effort: Pulmonary effort is normal.  Musculoskeletal:        General: Tenderness present.     Cervical back: Normal range of motion and neck supple.     Comments: Positive small amount of erythema and warmth around the left great MTP joint.  There is tenderness to  this area and tenderness with range of motion of the big toe.  There is no swelling of the ankle or lower leg.  No calf tenderness.  There are some mild tenderness of the right calf.  No swelling of the right leg.  Pedal pulses are intact.  Skin:    General: Skin is warm and dry.  Neurological:     Mental Status: She is alert and oriented to person, place, and time.     (all labs ordered are listed, but only abnormal results are displayed) Labs Reviewed - No data to display  EKG: None  Radiology: US  Venous Img Lower Bilateral Result Date: 08/06/2023 EXAM: ULTRASOUND DUPLEX OF THE BILATERAL LOWER EXTREMITY VEINS TECHNIQUE: Duplex ultrasound using B-mode/gray scaled imaging and Doppler spectral analysis and color flow was obtained of the deep venous structures of the bilateral  lower extremity. COMPARISON: None. CLINICAL HISTORY: Leg pain, swelling. FINDINGS: The visualized veins of the lower extremity are patent and free of echogenic thrombus. The veins demonstrate good compressibility with normal color flow study and spectral analysis. IMPRESSION: 1. No evidence of DVT. Electronically signed by: Norman Gatlin MD 08/06/2023 09:27 PM EDT RP Workstation: [id number]   DG Foot Complete Left Result Date: 08/06/2023 CLINICAL DATA:  Foot pain EXAM: LEFT FOOT - COMPLETE 3+ VIEW COMPARISON:  None Available. FINDINGS: Orthopedic screws seen in the first metatarsal neck. There is no acute fracture or dislocation. Joint spaces are well maintained. There is soft tissue swelling over the dorsal metatarsals. Plantar and posterior calcaneal spurs are present. IMPRESSION: 1. No acute fracture or dislocation. 2. Soft tissue swelling over the dorsal metatarsals. Electronically Signed   By: Greig Pique M.D.   On: 08/06/2023 18:47     Procedures   Medications Ordered in the ED  predniSONE  (DELTASONE ) tablet 60 mg (has no administration in time range)  doxycycline  (VIBRA -TABS) tablet 100 mg (has no administration in time range)                                    Medical Decision Making Amount and/or Complexity of Data Reviewed Radiology: ordered.  Risk Prescription drug management.   This patient presents to the ED for concern of foot pain, this involves an extensive number of treatment options, and is a complaint that carries with it a high risk of complications and morbidity.  I considered the following differential and admission for this acute, potentially life threatening condition.  The differential diagnosis includes arthritis, gout, septic joint, cellulitis, DVT, fracture  MDM:    Patient presents with pain and swelling to the first MTP joint of the left foot.  There is some mild erythema to the area.  Mild swelling.  No pain to the ankle or lower leg.  There is also some  mild calf tenderness in the right leg.  No chest pain or shortness of breath.  X-rays of the left foot do not reveal any underlying bony injuries.  The screw from her prior surgery is in place.  Ultrasound of both legs has not reveal any evidence of DVT.  Unclear if this is just an inflammatory arthritis or if she is developing a little infection in the area.  There is no significant joint swelling.  No effusion appreciated.  Will place patient on a short course of steroids as well as antibiotics.  Advised her to have close follow-up with her PCP to ensure improvement.  Return precautions were given.  (Labs, imaging, consults)  Labs: I Ordered, and personally interpreted labs.  The pertinent results include:     Imaging Studies ordered: I ordered imaging studies including x-ray, ultrasound I independently visualized and interpreted imaging. I agree with the radiologist interpretation  Additional history obtained from chart.  External records from outside source obtained and reviewed including prior notes  Cardiac Monitoring: The patient does not maintained on a cardiac monitor.  If on the cardiac monitor, I personally viewed and interpreted the cardiac monitored which showed an underlying rhythm of:    Reevaluation: After the interventions noted above, I reevaluated the patient and found that they have :stayed the same  Social Determinants of Health:    Disposition: Discharged to home  Co morbidities that complicate the patient evaluation  Past Medical History:  Diagnosis Date   Aortic valve sclerosis 09/10/2021   Echocardiogram 08/2021; no regurg. Nl mitral valve. Nl EF.    Hyperlipidemia    Hypertension      Medicines Meds ordered this encounter  Medications   predniSONE  (DELTASONE ) tablet 60 mg   doxycycline  (VIBRA -TABS) tablet 100 mg   predniSONE  (DELTASONE ) 20 MG tablet    Sig: 2 tabs po daily x 4 days    Dispense:  8 tablet    Refill:  0   doxycycline  (VIBRAMYCIN ) 100  MG capsule    Sig: Take 1 capsule (100 mg total) by mouth 2 (two) times daily. One po bid x 7 days    Dispense:  14 capsule    Refill:  0    I have reviewed the patients home medicines and have made adjustments as needed  Problem List / ED Course: Problem List Items Addressed This Visit   None Visit Diagnoses       Arthralgia of left foot    -  Primary                Final diagnoses:  Arthralgia of left foot    ED Discharge Orders          Ordered    predniSONE  (DELTASONE ) 20 MG tablet        08/06/23 2143    doxycycline  (VIBRAMYCIN ) 100 MG capsule  2 times daily        08/06/23 2143               Lenor Hollering, MD 08/06/23 2149

## 2023-08-06 NOTE — Discharge Instructions (Addendum)
 Follow-up closely with your primary care doctor to reassess the area.  Return to the emergency room if you have any worsening symptoms.

## 2023-08-06 NOTE — ED Notes (Signed)
 ED Provider at bedside.

## 2023-08-06 NOTE — ED Triage Notes (Signed)
 Arrives POV with complaints of right leg pain and left foot pain x1 day. Patient reports no falls or injuries. Sent here by UC to rule out a clot.

## 2023-11-17 ENCOUNTER — Other Ambulatory Visit: Payer: Self-pay | Admitting: Family Medicine

## 2024-02-08 ENCOUNTER — Telehealth: Payer: Self-pay

## 2024-02-08 NOTE — Telephone Encounter (Signed)
 Pharmacy Patient Advocate Encounter   Received notification from [employer] that prior authorization for Wegovy  0.25MG /0.5ML auto-injectors  is required/requested.   Insurance verification completed.   The patient is insured through [facility].   Per test claim: PA required; However, NEW/RECENT labs/notes are needed to complete & submit PA request. Please see below.   Last office visit was 04/03/23. New chart notes with current weight is required for PA.

## 2024-02-23 ENCOUNTER — Other Ambulatory Visit: Payer: Self-pay | Admitting: Family Medicine
